# Patient Record
Sex: Male | Born: 1960 | Race: White | Hispanic: No | Marital: Married | Smoking: Never smoker
Health system: Southern US, Community
[De-identification: ages and names within clinical notes are randomized; demographics above are authoritative.]

## PROBLEM LIST (undated history)

## (undated) DIAGNOSIS — R7303 Prediabetes: Secondary | ICD-10-CM

## (undated) DIAGNOSIS — J45909 Unspecified asthma, uncomplicated: Secondary | ICD-10-CM

## (undated) HISTORY — PX: BACK SURGERY: SHX140

## (undated) HISTORY — DX: Unspecified asthma, uncomplicated: J45.909

---

## 2005-03-20 ENCOUNTER — Other Ambulatory Visit: Payer: Self-pay

## 2005-03-20 ENCOUNTER — Emergency Department: Payer: Self-pay | Admitting: Emergency Medicine

## 2007-01-25 ENCOUNTER — Emergency Department: Payer: Self-pay | Admitting: Emergency Medicine

## 2008-08-09 ENCOUNTER — Inpatient Hospital Stay: Payer: Self-pay | Admitting: General Surgery

## 2009-03-06 ENCOUNTER — Emergency Department: Payer: Self-pay | Admitting: Emergency Medicine

## 2009-03-15 ENCOUNTER — Ambulatory Visit: Payer: Self-pay | Admitting: Unknown Physician Specialty

## 2011-04-20 ENCOUNTER — Ambulatory Visit: Payer: Self-pay | Admitting: Internal Medicine

## 2013-05-12 ENCOUNTER — Emergency Department: Payer: Self-pay | Admitting: Emergency Medicine

## 2013-09-29 ENCOUNTER — Emergency Department: Payer: Self-pay | Admitting: Emergency Medicine

## 2013-09-29 LAB — CBC
HCT: 41.9 % (ref 40.0–52.0)
HGB: 14.4 g/dL (ref 13.0–18.0)
MCH: 30.3 pg (ref 26.0–34.0)
MCHC: 34.3 g/dL (ref 32.0–36.0)
MCV: 88 fL (ref 80–100)
Platelet: 194 10*3/uL (ref 150–440)
RBC: 4.75 10*6/uL (ref 4.40–5.90)
RDW: 13.5 % (ref 11.5–14.5)
WBC: 6.4 10*3/uL (ref 3.8–10.6)

## 2013-09-29 LAB — BASIC METABOLIC PANEL
Anion Gap: 4 — ABNORMAL LOW (ref 7–16)
BUN: 21 mg/dL — AB (ref 7–18)
CALCIUM: 8.9 mg/dL (ref 8.5–10.1)
CREATININE: 1.24 mg/dL (ref 0.60–1.30)
Chloride: 103 mmol/L (ref 98–107)
Co2: 28 mmol/L (ref 21–32)
EGFR (Non-African Amer.): >60
GLUCOSE: 170 mg/dL — AB (ref 65–99)
Osmolality: 277 (ref 275–301)
Potassium: 3.8 mmol/L (ref 3.5–5.1)
Sodium: 135 mmol/L — ABNORMAL LOW (ref 136–145)

## 2013-09-29 LAB — TROPONIN I: Troponin-I: <0.02 ng/mL

## 2015-11-20 ENCOUNTER — Emergency Department: Payer: No Typology Code available for payment source

## 2015-11-20 ENCOUNTER — Emergency Department
Admission: EM | Admit: 2015-11-20 | Discharge: 2015-11-20 | Disposition: A | Discharge status: Home / Self Care | Payer: No Typology Code available for payment source | Attending: Emergency Medicine | Admitting: Emergency Medicine

## 2015-11-20 DIAGNOSIS — R200 Anesthesia of skin: Secondary | ICD-10-CM | POA: Insufficient documentation

## 2015-11-20 DIAGNOSIS — R079 Chest pain, unspecified: Secondary | ICD-10-CM | POA: Insufficient documentation

## 2015-11-20 DIAGNOSIS — R202 Paresthesia of skin: Secondary | ICD-10-CM | POA: Insufficient documentation

## 2015-11-20 LAB — BASIC METABOLIC PANEL
ANION GAP: 8 (ref 5–15)
BUN: 23 mg/dL — ABNORMAL HIGH (ref 6–20)
CALCIUM: 9 mg/dL (ref 8.9–10.3)
CO2: 24 mmol/L (ref 22–32)
Chloride: 105 mmol/L (ref 101–111)
Creatinine, Ser: 1.25 mg/dL — ABNORMAL HIGH (ref 0.61–1.24)
GFR calc non Af Amer: >60 mL/min (ref >60)
Glucose, Bld: 137 mg/dL — ABNORMAL HIGH (ref 65–99)
POTASSIUM: 3.8 mmol/L (ref 3.5–5.1)
Sodium: 137 mmol/L (ref 135–145)

## 2015-11-20 LAB — CBC
HCT: 40.5 % (ref 40.0–52.0)
Hemoglobin: 14.2 g/dL (ref 13.0–18.0)
MCH: 31.1 pg (ref 26.0–34.0)
MCHC: 35 g/dL (ref 32.0–36.0)
MCV: 88.8 fL (ref 80.0–100.0)
Platelets: 200 10*3/uL (ref 150–440)
RBC: 4.56 MIL/uL (ref 4.40–5.90)
RDW: 13.2 % (ref 11.5–14.5)
WBC: 7.5 10*3/uL (ref 3.8–10.6)

## 2015-11-20 LAB — TROPONIN I: Troponin I: <0.03 ng/mL (ref <0.031)

## 2015-11-20 MED ORDER — NITROGLYCERIN 2 % TD OINT
1.0000 [in_us] | TOPICAL_OINTMENT | Freq: Four times a day (QID) | TRANSDERMAL | Status: DC
  Administered 2015-11-20: 1 [in_us] via TOPICAL
  Filled 2015-11-20: qty 1

## 2015-11-20 MED ORDER — ACETAMINOPHEN 500 MG PO TABS
ORAL_TABLET | ORAL | Status: AC
  Filled 2015-11-20: qty 2

## 2015-11-20 MED ORDER — NITROGLYCERIN 0.4 MG SL SUBL
0.4000 mg | SUBLINGUAL_TABLET | SUBLINGUAL | Status: DC | PRN
  Administered 2015-11-20 (×2): 0.4 mg via SUBLINGUAL
  Filled 2015-11-20 (×2): qty 1

## 2015-11-20 MED ORDER — ACETAMINOPHEN 500 MG PO TABS
1000.0000 mg | ORAL_TABLET | Freq: Once | ORAL | Status: AC
  Administered 2015-11-20: 1000 mg via ORAL

## 2015-11-20 NOTE — Discharge Instructions (Signed)
Please go to Dr. Philemon Kingdom office at 11:30 AM for a stress test tomorrow morning.  Return to the emergency department if you develop chest pain, shortness of breath, cold or clammy feeling, nausea or vomiting, palpitations, lightheadedness or fainting, or any other symptoms concerning to you.

## 2015-11-20 NOTE — ED Provider Notes (Signed)
[facility] Emergency Department Provider Note  ____________________________________________  Time seen: Approximately 5:31 PM  I have reviewed the triage vital signs and the nursing notes.   HISTORY  Chief Complaint Chest Pain    HPI Chad Rose. is a 55 y.o. male with no past medical history presenting with chest pain. Patient states that at 12:30 today he was at work when he developed a left-sided chest "pressure" with associated numbness and tingling in the left arm. He felt hot but did not break out in a sweat, and denies any nausea, vomiting, lightheadedness, palpitations or syncope. Today he has not been experiencing other episodes of chest pain. He does not have any diagnosed past medical history, but does not see a physician regularly. Last stress test was several years ago and is reportedly negative. Patient was given 3 baby aspirin by EMS.  SH: Denies tobacco abuse or cocaine.  FH: Denies CAD in family members in young ages   History reviewed. No pertinent past medical history.  There are no active problems to display for this patient.   History reviewed. No pertinent past surgical history.  No current outpatient prescriptions on file.  Allergies Review of patient's allergies indicates no known allergies.  No family history on file.  Social History Social History  Substance Use Topics  . Smoking status: Never Smoker   . Smokeless tobacco: None  . Alcohol Use: No    Review of Systems Constitutional: No fever/chills. No lightheadedness or syncope. Positive for hot. No diaphoresis. Eyes: No visual changes. ENT: No sore throat. Cardiovascular: Positive chest pain, without palpitations. Respiratory: Denies shortness of breath.  No cough. Gastrointestinal: No abdominal pain.  No nausea, no vomiting.  No diarrhea.  No constipation. Genitourinary: Negative for dysuria. Musculoskeletal: Negative for back pain. Positive left upper  extremity tingling. Skin: Negative for rash. Neurological: Negative for headaches, focal weakness or numbness.  10-point ROS otherwise negative.  ____________________________________________   PHYSICAL EXAM:  VITAL SIGNS: ED Triage Vitals  Enc Vitals Group     BP --      Pulse --      Resp --      Temp 11/20/15 1721 98 F (36.7 C)     Temp src --      SpO2 --      Weight --      Height 11/20/15 1721  (1.753 m)     Head Cir --      Peak Flow --      Pain Score 11/20/15 1723 5     Pain Loc --      Pain Edu? --      Excl. in GC? --     Constitutional: Alert and oriented. Well appearing and in no acute distress. Answer question appropriately. Eyes: Conjunctivae are normal.  EOMI. Head: Atraumatic. Nose: No congestion/rhinnorhea. Mouth/Throat: Mucous membranes are moist.  Neck: No stridor.  Supple.  No JVD Cardiovascular: Normal rate, regular rhythm. No murmurs, rubs or gallops.  Respiratory: Normal respiratory effort.  No retractions. Lungs CTAB.  No wheezes, rales or ronchi. Gastrointestinal: Soft and nontender. No distention. No peritoneal signs. Musculoskeletal: No LE edema. No palpable cords or tenderness to palpation in the calves. Negative Homans sign. Neurologic:  Normal speech and language. No gross focal neurologic deficits are appreciated.  Skin:  Skin is warm, dry and intact. No rash noted. Psychiatric: Mood and affect are normal. Speech and behavior are normal.  Normal judgement.  ____________________________________________  LABS (all labs ordered are listed, but only abnormal results are displayed)  Labs Reviewed  BASIC METABOLIC PANEL - Abnormal; Notable for the following:    Glucose, Bld 137 (*)    BUN 23 (*)    Creatinine, Ser 1.25 (*)    All other components within normal limits  CBC  TROPONIN I   ____________________________________________  EKG  ED ECG REPORT I, Rockne Menghini, the attending physician, personally viewed and  interpreted this ECG.   Date: 11/20/2015  EKG Time: 1716  Rate: 80  Rhythm: normal sinus rhythm  Axis: Normal  Intervals:none  ST&T Change: No ST elevation. No ischemic changes.  ____________________________________________  RADIOLOGY  Dg Chest 2 View  11/20/2015  CLINICAL DATA:  Chest pain starting 3 hours ago. No known injury. Initial encounter. EXAM: CHEST  2 VIEW COMPARISON:  PA and lateral chest 09/29/2013. FINDINGS: The lungs are clear. Heart size is normal. There is no pneumothorax or pleural effusion. No focal bony abnormality. IMPRESSION: Negative chest. Electronically Signed   By: Drusilla Kanner M.D.   On: 11/20/2015 17:57    ____________________________________________   PROCEDURES  Procedure(s) performed: None  Critical Care performed: No ____________________________________________   INITIAL IMPRESSION / ASSESSMENT AND PLAN / ED COURSE  Pertinent labs & imaging results that were available during my care of the patient were reviewed by me and considered in my medical decision making (see chart for details).  55 y.o. male with no known cardiac risk factors including in his medical history or his social history, or his family history, presenting with a single episode of chest pain which started 5.5 hours ago. The patient's EKG is reassuring and does not show any ischemic changes. I have a troponin that is pending. We also get a chest x-ray. The patient has a reassuring physical examination as well. The patient is a low risk patient without a history that is suggestive of escalating angina, so if his workup in the emergency department is reassuring, I will contact cardiology to schedule an outpatient stress test for further cardiac risk stratification.  ----------------------------------------- 6:16 PM on 11/20/2015 -----------------------------------------  The patient's pain has resolved with sublingual nitroglycerin. He remains clinically stable. His EKG does not  show ischemic changes and his troponin is negative. His no abnormalities on his chest x-ray. I will speak with cardiology for a follow-up appointment for stress testing, and plan discharge home. Return precautions as well as follow up instructions are discussed.  ____________________________________________  FINAL CLINICAL IMPRESSION(S) / ED DIAGNOSES  Final diagnoses:  Chest pain, unspecified chest pain type  Numbness and tingling in left arm      NEW MEDICATIONS STARTED DURING THIS VISIT:  New Prescriptions   No medications on file     Rockne Menghini, MD 11/20/15 [facility]

## 2015-11-20 NOTE — ED Notes (Addendum)
Pt from [facility] urgent care via EMS, reports central chest pain for past 3 hours with radiation down left arm Pt took 325 aspirin prior to arrival

## 2015-11-20 NOTE — ED Notes (Signed)
Reviewed d/c instructions, and follow-up care with pt. Pt verbalized understanding 

## 2015-11-20 NOTE — ED Notes (Signed)
Removed nitro paste per MD order

## 2017-09-09 DIAGNOSIS — J329 Chronic sinusitis, unspecified: Secondary | ICD-10-CM | POA: Diagnosis not present

## 2017-09-09 DIAGNOSIS — R062 Wheezing: Secondary | ICD-10-CM | POA: Diagnosis not present

## 2017-11-25 ENCOUNTER — Ambulatory Visit (INDEPENDENT_AMBULATORY_CARE_PROVIDER_SITE_OTHER): Payer: BLUE CROSS/BLUE SHIELD | Admitting: Family Medicine

## 2017-11-25 ENCOUNTER — Encounter: Payer: Self-pay | Admitting: Family Medicine

## 2017-11-25 VITALS — BP 102/69 | HR 74 | Temp 98.0°F | Resp 16 | Ht 69.0 in | Wt 191.0 lb

## 2017-11-25 DIAGNOSIS — R5383 Other fatigue: Secondary | ICD-10-CM

## 2017-11-25 DIAGNOSIS — Z7689 Persons encountering health services in other specified circumstances: Secondary | ICD-10-CM | POA: Diagnosis not present

## 2017-11-25 DIAGNOSIS — R7309 Other abnormal glucose: Secondary | ICD-10-CM | POA: Diagnosis not present

## 2017-11-25 DIAGNOSIS — Z125 Encounter for screening for malignant neoplasm of prostate: Secondary | ICD-10-CM

## 2017-11-25 DIAGNOSIS — N401 Enlarged prostate with lower urinary tract symptoms: Secondary | ICD-10-CM | POA: Diagnosis not present

## 2017-11-25 DIAGNOSIS — F5101 Primary insomnia: Secondary | ICD-10-CM | POA: Diagnosis not present

## 2017-11-25 DIAGNOSIS — Z0000 Encounter for general adult medical examination without abnormal findings: Secondary | ICD-10-CM | POA: Diagnosis not present

## 2017-11-25 DIAGNOSIS — R351 Nocturia: Secondary | ICD-10-CM

## 2017-11-25 MED ORDER — TAMSULOSIN HCL 0.4 MG PO CAPS: 0.4000 mg | ORAL_CAPSULE | Freq: Every day | ORAL | 5 refills | Status: DC

## 2017-11-25 NOTE — Assessment & Plan Note (Signed)
See A&P Suspect related to poor sleep hygiene primarily Nocturia with BPH affecting

## 2017-11-25 NOTE — Assessment & Plan Note (Signed)
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 Start Tamsulosin 0.4mg  daily, advised on benefits, risks, if BP low caution with sudden standing up or position change 2. Follow-up consider future increased dose (doxazosin) vs add Finasteride alpha blocker to reduce prostate size (note change in PSA), future referral to Urology if remains uncontrolled

## 2017-11-25 NOTE — Assessment & Plan Note (Signed)
Seems multifactorial, presumed poor sleep hygiene, long work hours, waking up overnight with nocturia d/t BPH Not suggestive of OSA, despite daytime sleepiness, neg screening  Plan - Treat BPH with Flomax for better sleep - Sleep hygiene handout given - Check labs for other etiology, history of elevated glucose / will check TSH CBC chemistry - Follow-up 4 weeks

## 2017-11-25 NOTE — Progress Notes (Signed)
Subjective:    Patient ID: Chad Gross., male    DOB: [birth year], 57 y.o.   MRN: [id number]  Chad Currier. is a 57 y.o. male presenting on 11/25/2017 for Establish Care (fatigue,exhausted, no energy)  Has not had a regular doctor >15+ years. He is local and has been living in this area for while now. He has 2 children Aneta Mins and [place], and older son, Sharia Reeve in [place].  HPI   History of Back Surgeries - Reports prior history of x 4 back surgeries (initial in early 1990s), last done 2011, L4-5 laminectomy, has not had fusion. Most recently with [facility] Dr Gerrit Heck  History of Abnormal EKG Reports some mild chest pain, had EKG showed abnormal lead, he was taken to see Cardiology to follow-up with Dr Gwen Pounds [facility], had stress test that was negative.  Mild Asthma intermittent History of childhood asthma, with some mild lingering symptoms, has rare occasionally flare up with wheezing and short of breath with excessive exertion.  Tiredness / Excessive Daytime / Insomnia Reports poor sleep over >30 years, goes to sleep late usually due to work. More recently waking up overnight frequent. Sometimes he is sleeping on couch to wake up too much. He cannot fall asleep without white noise, noise machine, often his mind will be overactive. - No history of OSA or problems with apnea events witnessed or breathing at night. 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 No physical demands of job. 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   BPH / Sleep Nocturia He was seen by Urologist few years ago, was dx with BPH after DRE and symptoms, no medicine at that time, but he may need. Prostate 5XL vitamin supplement x 2 daily, started 1 week ago. He feels slight improvement with less nocturia now 2-3 times instead of 5-6. He admits he is not sleeping well at  night and will have urge to urinate.  AUA BPH Symptom Score over past 1 month 1. Sensation of not emptying bladder post void - 1 2. Urinate less than 2 hour after finish last void - 1 3. Start/Stop several times during void - 3 4. Difficult to postpone urination - 0 5. Weak urinary stream - 3 6. Push or strain urination - 2 7. Nocturia - 3-5 times (improved on prostate supplement)  Total Score: 14 (Moderate BPH symptoms)   Epworth Sleepiness Scale Total Score: 3 Sitting and reading - 0 Watching TV - 3 Sitting inactive in a public place - 0 As a passenger in a car for an hour without a break - 0 Lying down to rest in the afternoon when circumstances permit - 0 Sitting and talking to someone - 0 Sitting quietly after a lunch without alcohol - 0 In a car, while stopped for a few minutes in traffic - 0   STOP-Bang OSA scoring Snoring yes   Tiredness yes   Observed apneas no   Pressure HTN no   BMI > 35 kg/m2 no   Age > 50  yes   Neck (male >17 in; Male >16 in)  yes 31.5"  Gender male yes   OSA risk low (0-2)  OSA risk intermediate (3-4)  OSA risk high (5+)  Total: 5 (High Risk)   Health Maintenance:  Colon CA Screening: Never had colonoscopy. Currently asymptomatic. No  known family history of colon CA. Due for screening test age 61>50, will consider referral to GI at next visit, possible cologuard as well.   Depression screen PHQ 2/9 11/25/2017  Decreased Interest 0  Down, Depressed, Hopeless 0  PHQ - 2 Score 0    Past Medical History:  Diagnosis Date  . Asthma    Past Surgical History:  Procedure Laterality Date  . BACK SURGERY     Social History   Socioeconomic History  . Marital status: Married    Spouse name: Not on file  . Number of children: 3  . Years of education: [employer]  . Highest education level: High school graduate  Social Needs  . Financial resource strain: Not on file  . Food insecurity - worry: Not on file  . Food insecurity -  inability: Not on file  . Transportation needs - medical: Not on file  . Transportation needs - non-medical: Not on file  Occupational History  . Occupation: Nurse, [employer] Manager    Comment: [place] / [place]  Tobacco Use  . Smoking status: Never Smoker  . Smokeless tobacco: Never Used  Substance and Sexual Activity  . Alcohol use: Yes    Alcohol/week: 2.4 oz    Types: 4 Cans of beer per week  . Drug use: No  . Sexual activity: Not on file  Other Topics Concern  . Not on file  Social History Narrative  . Not on file   Family History  Problem Relation Age of Onset  . Throat cancer Mother   . Alcohol abuse Father   . Cirrhosis Father   . Pneumonia Father   . Diabetes Neg Hx   . Prostate cancer Neg Hx   . Colon cancer Neg Hx    Current Outpatient Medications on File Prior to Visit  Medication Sig  . Multiple Vitamin (MULTIVITAMIN) capsule Take 1 capsule by mouth daily.  . naproxen sodium (ALEVE) 220 MG tablet Take 220 mg by mouth.  Marland Kitchen. Specialty Vitamins Products (PROSTATE PO) Take by mouth.   No current facility-administered medications on file prior to visit.     Review of Systems  Constitutional: Negative for activity change, appetite change, chills, diaphoresis, fatigue and fever.  HENT: Negative for congestion and hearing loss.   Eyes: Negative for visual disturbance.  Respiratory: Negative for apnea, cough, chest tightness, shortness of breath and wheezing.   Cardiovascular: Negative for chest pain, palpitations and leg swelling.  Gastrointestinal: Negative for abdominal pain, anal bleeding, blood in stool, constipation, diarrhea, nausea and vomiting.  Endocrine: Negative for cold intolerance and polyuria.  Genitourinary: Negative for decreased urine volume, difficulty urinating, dysuria, frequency, hematuria, testicular pain and urgency.       Nocturia, LUTS  Musculoskeletal: Negative for arthralgias and neck pain.  Skin: Negative for rash.  Allergic/Immunologic:  Negative for environmental allergies.  Neurological: Negative for dizziness, weakness, light-headedness, numbness and headaches.  Hematological: Negative for adenopathy.  Psychiatric/Behavioral: Negative for behavioral problems, dysphoric mood and sleep disturbance. The patient is not nervous/anxious.    Per HPI unless specifically indicated above     Objective:    BP 102/69   Pulse 74   Temp 98 F (36.7 C) (Oral)   Resp 16   Ht 5\' 9"  (1.753 m)   Wt 191 lb (86.6 kg)   BMI 28.21 kg/m   Wt Readings from Last 3 Encounters:  11/25/17 191 lb (86.6 kg)    Physical Exam  Constitutional: He is oriented to person, place,  and time. He appears well-developed and well-nourished. No distress.  Well-appearing, comfortable, cooperative  HENT:  Head: Normocephalic and atraumatic.  Mouth/Throat: Oropharynx is clear and moist.  Eyes: Conjunctivae and EOM are normal. Pupils are equal, round, and reactive to light. Right eye exhibits no discharge. Left eye exhibits no discharge.  Neck: Normal range of motion. Neck supple. No thyromegaly present.  Cardiovascular: Normal rate, regular rhythm, normal heart sounds and intact distal pulses.  No murmur heard. Pulmonary/Chest: Effort normal and breath sounds normal. No respiratory distress. He has no wheezes. He has no rales.  Abdominal: Soft. Bowel sounds are normal. He exhibits no distension and no mass. There is no tenderness.  Musculoskeletal: Normal range of motion. He exhibits no edema or tenderness.  Upper / Lower Extremities: - Normal muscle tone, strength bilateral upper extremities 5/5, lower extremities 5/5  Lymphadenopathy:    He has no cervical adenopathy.  Neurological: He is alert and oriented to person, place, and time.  Distal sensation intact to light touch all extremities  Skin: Skin is warm and dry. No rash noted. He is not diaphoretic. No erythema.  Psychiatric: He has a normal mood and affect. His behavior is normal.  Well  groomed, good eye contact, normal speech and thoughts  Nursing note and vitals reviewed.  Results for orders placed or performed during the hospital encounter of 11/20/15  CBC  Result Value Ref Range   WBC 7.5 3.8 - 10.6 K/uL   RBC 4.56 4.40 - 5.90 MIL/uL   Hemoglobin 14.2 13.0 - 18.0 g/dL   HCT 81.1 91.4 - 78.2 %   MCV 88.8 80.0 - 100.0 fL   MCH 31.1 26.0 - 34.0 pg   MCHC 35.0 32.0 - 36.0 g/dL   RDW 95.6 21.3 - 08.6 %   Platelets 200 150 - 440 K/uL  Basic metabolic panel  Result Value Ref Range   Sodium 137 135 - 145 mmol/L   Potassium 3.8 3.5 - 5.1 mmol/L   Chloride 105 101 - 111 mmol/L   CO2 24 22 - 32 mmol/L   Glucose, Bld 137 (H) 65 - 99 mg/dL   BUN 23 (H) 6 - 20 mg/dL   Creatinine, Ser 5.78 (H) 0.61 - 1.24 mg/dL   Calcium 9.0 8.9 - 46.9 mg/dL   GFR calc non Af Amer >60 >60 mL/min   GFR calc Af Amer >60 >60 mL/min   Anion gap 8 5 - 15  Troponin I  Result Value Ref Range   Troponin I <0.03 <0.031 ng/mL      Assessment & Plan:   Problem List Items Addressed This Visit    Benign prostatic hyperplasia with nocturia - Primary    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 Start Tamsulosin 0.4mg  daily, advised on benefits, risks, if BP low caution with sudden standing up or position change 2. Follow-up consider future increased dose (doxazosin) vs add Finasteride alpha blocker to reduce prostate size (note change in PSA), future referral to Urology if remains uncontrolled      Relevant Medications   tamsulosin (FLOMAX) 0.4 MG CAPS capsule   Primary insomnia    See A&P Suspect related to poor sleep hygiene  primarily Nocturia with BPH affecting       Relevant Orders   TSH   T4, free   VITAMIN  D 25 Hydroxy (Vit-D Deficiency, Fractures)   Tiredness    Seems multifactorial, presumed poor sleep hygiene, long work hours, waking up overnight with nocturia d/t BPH Not suggestive of OSA, despite daytime sleepiness, neg screening  Plan - Treat BPH with Flomax for better sleep - Sleep hygiene handout given - Check labs for other etiology, history of elevated glucose / will check TSH CBC chemistry - Follow-up 4 weeks      Relevant Orders   TSH   CBC with Differential/Platelet   T4, free   VITAMIN D 25 Hydroxy (Vit-D Deficiency, Fractures)    Other Visit Diagnoses    Encounter to establish care with new doctor       Annual physical exam     Reviewed health maintenance Declined routine HIV/Hep C Proceed with Colon CA Screening colonoscopy most likely in future Encourage improve healthy lifestyle diet and exercise, maintain healthy wt    Relevant Orders   COMPLETE METABOLIC PANEL WITH GFR   Lipid panel   Hemoglobin A1c   TSH   CBC with Differential/Platelet   Screening for prostate cancer       Check PSA   Relevant Orders   PSA, Total with Reflex to PSA, Free   Fatigue, unspecified type       Relevant Orders   CBC with Differential/Platelet   VITAMIN D 25 Hydroxy (Vit-D Deficiency, Fractures)   Abnormal glucose       Relevant Orders   Hemoglobin A1c      Meds ordered this encounter  Medications  . tamsulosin (FLOMAX) 0.4 MG CAPS capsule    Sig: Take 1 capsule (0.4 mg total) by mouth daily.    Dispense:  30 capsule    Refill:  5    Follow up plan: Return in about 4 weeks (around 12/23/2017) for BPH, Tiredness, follow-up.  Saralyn Pilar, DO [facility] 11/25/2017, 7:00 PM

## 2017-11-25 NOTE — Patient Instructions (Addendum)
Thank you for coming to the office today  1. Blood today - fasting labs -stay tuned for results 24-48 hours, likely next week - mychart, or call if questions  Start Tamsulosin (Flomax) 0.31m once daily in morning - should work quickly, caution sudden standing if light headed.  If need can switch pill to take at night-time.  AUA BPH Symptom Score over past 1 month 1. Sensation of not emptying bladder post void - 1 2. Urinate less than 2 hour after finish last void - 1 3. Start/Stop several times during void - 3 4. Difficult to postpone urination - 0 5. Weak urinary stream - 3 6. Push or strain urination - 2 7. Nocturia - 3-5 times (improved on prostate supplement)  Try to limit liquids in evening, pick a cut off time approx 9pm  Reduce sodas, and drink more water.  Sleep Hygiene Recommendations to promote healthy sleep in all patients, especially if symptoms of insomnia are worsening. Due to the nature of sleep rhythms, if your body gets "out of rhythm", it may take some time before your sleep cycle can be "reset".  Please try to follow as many of the following tips as you can, usually there are only a few of these are the primary cause of the problem.  ?To reset your sleep rhythm, go to bed and get up at the same time every day ?Sleep only long enough to feel rested and then get out of bed ?Do not try to force yourself to sleep. If you can't sleep, get out of bed and try again later. ?Avoid naps during the day, unless excessively tired. The more sleeping during the day, then the less sleep your body needs at night.  ?Have coffee, tea, and other foods that have caffeine only in the morning ?Exercise several days a week, but not right before bed ?If you drink alcohol, prefer to have appropriate drink with one meal, but prefer to avoid alcohol in the evening, and bedtime ?If you smoke, avoid smoking, especially in the evening  ?Avoid watching TV or looking at phones, computers, or  reading devices ("e-books") that give off light at least 30 minutes before bed. This artificial light sends "awake signals" to your brain and can make it harder to fall asleep. ?Make your bedroom a comfortable place where it is easy to fall asleep: ? Put up shades or special blackout curtains to block light from outside. ? Use a white noise machine to block noise. ? Keep the temperature cool. ?Try your best to solve or at least address your problems before you go to bed ?Use relaxation techniques to manage stress. Ask your health care provider to suggest some techniques that may work well for you. These may include: ? Breathing exercises. ? Routines to release muscle tension. ? Visualizing peaceful scenes.   Colon Cancer Screening: - For all adults age 364+routine colon cancer screening is highly recommended.     - Recent guidelines from [place] starting age of 446- Early detection of colon cancer is important, because often there are no warning signs or symptoms, also if found early usually it can be cured. Late stage is hard to treat.  - If you are not interested in Colonoscopy screening (if done and normal you could be cleared for 5 to 10 years until next due), then Cologuard is an excellent alternative for screening test for Colon Cancer. It is highly sensitive for detecting DNA of colon cancer from even  the earliest stages. Also, there is NO bowel prep required. - If Cologuard is NEGATIVE, then it is good for 3 years before next due - If Cologuard is POSITIVE, then it is strongly advised to get a Colonoscopy, which allows the GI doctor to locate the source of the cancer or polyp (even very early stage) and treat it by removing it. ------------------------- If you would like to proceed with Cologuard (stool DNA test) - FIRST, call your insurance company and tell them you want to check cost of Cologuard tell them CPT Code [phone] (it may be completely covered and you  could get for no cost, OR max cost without any coverage is about $600). Also, keep in mind if you do NOT open the kit, and decide not to do the test, you will NOT be charged, you should contact the company if you decide not to do the test. - If you want to proceed, you can notify us (phone message, [place], or at next visit) and we will order it for you. The test kit will be delivered to you house within about 1 week. Follow instructions to collect sample, you may call the company for any help or questions, 24/7 telephone support at [phone].  Future colonoscopy let me know for referral   Please schedule a Follow-up Appointment to: Return in about 4 weeks (around 12/23/2017) for BPH, Tiredness, follow-up.    If you have any other questions or concerns, please feel free to call the office or send a message through [place]. You may also schedule an earlier appointment if necessary.  Additionally, you may be receiving a survey about your experience at our office within a few days to 1 week by e-mail or mail. We value your feedback.  Nobie Putnam, DO [place]

## 2017-11-26 ENCOUNTER — Encounter: Payer: Self-pay | Admitting: Family Medicine

## 2017-11-26 DIAGNOSIS — E1169 Type 2 diabetes mellitus with other specified complication: Secondary | ICD-10-CM | POA: Insufficient documentation

## 2017-11-26 LAB — CBC WITH DIFFERENTIAL/PLATELET
BASOS ABS: 22 {cells}/uL (ref 0–200)
BASOS PCT: 0.4 %
EOS PCT: 1.1 %
Eosinophils Absolute: 61 cells/uL (ref 15–500)
HCT: 45.2 % (ref 38.5–50.0)
Hemoglobin: 15.9 g/dL (ref 13.2–17.1)
Lymphs Abs: 2321 cells/uL (ref 850–3900)
MCH: 31.1 pg (ref 27.0–33.0)
MCHC: 35.2 g/dL (ref 32.0–36.0)
MCV: 88.5 fL (ref 80.0–100.0)
MONOS PCT: 9.1 %
MPV: 9.9 fL (ref 7.5–12.5)
NEUTROS ABS: 2596 {cells}/uL (ref 1500–7800)
Neutrophils Relative %: 47.2 %
PLATELETS: 209 10*3/uL (ref 140–400)
RBC: 5.11 10*6/uL (ref 4.20–5.80)
RDW: 12.6 % (ref 11.0–15.0)
Total Lymphocyte: 42.2 %
WBC mixed population: 501 cells/uL (ref 200–950)
WBC: 5.5 10*3/uL (ref 3.8–10.8)

## 2017-11-26 LAB — COMPLETE METABOLIC PANEL WITH GFR
AG RATIO: 1.4 (calc) (ref 1.0–2.5)
ALT: 31 U/L (ref 9–46)
AST: 21 U/L (ref 10–35)
Albumin: 4.5 g/dL (ref 3.6–5.1)
Alkaline phosphatase (APISO): 93 U/L (ref 40–115)
BUN: 17 mg/dL (ref 7–25)
CALCIUM: 9.8 mg/dL (ref 8.6–10.3)
CO2: 29 mmol/L (ref 20–32)
Chloride: 102 mmol/L (ref 98–110)
Creat: 1.14 mg/dL (ref 0.70–1.33)
GFR, EST AFRICAN AMERICAN: 83 mL/min/{1.73_m2} (ref >=60)
GFR, Est Non African American: 71 mL/min/{1.73_m2} (ref >=60)
GLUCOSE: 124 mg/dL — AB (ref 65–99)
Globulin: 3.3 g/dL (calc) (ref 1.9–3.7)
POTASSIUM: 4.8 mmol/L (ref 3.5–5.3)
Sodium: 138 mmol/L (ref 135–146)
TOTAL PROTEIN: 7.8 g/dL (ref 6.1–8.1)
Total Bilirubin: 0.5 mg/dL (ref 0.2–1.2)

## 2017-11-26 LAB — LIPID PANEL
Cholesterol: 264 mg/dL — ABNORMAL HIGH (ref <200)
HDL: 40 mg/dL — AB (ref >40)
LDL CHOLESTEROL (CALC): 166 mg/dL — AB
Non-HDL Cholesterol (Calc): 224 mg/dL (calc) — ABNORMAL HIGH (ref <130)
TRIGLYCERIDES: 345 mg/dL — AB (ref <150)
Total CHOL/HDL Ratio: 6.6 (calc) — ABNORMAL HIGH (ref <5.0)

## 2017-11-26 LAB — HEMOGLOBIN A1C
HEMOGLOBIN A1C: 7 %{Hb} — AB (ref <5.7)
Mean Plasma Glucose: 154 (calc)
eAG (mmol/L): 8.5 (calc)

## 2017-11-26 LAB — T4, FREE: Free T4: 1.7 ng/dL (ref 0.8–1.8)

## 2017-11-26 LAB — PSA, TOTAL WITH REFLEX TO PSA, FREE: PSA, Total: 0.6 ng/mL (ref <=4.0)

## 2017-11-26 LAB — TSH: TSH: 5.25 m[IU]/L — AB (ref 0.40–4.50)

## 2017-11-26 LAB — VITAMIN D 25 HYDROXY (VIT D DEFICIENCY, FRACTURES): VIT D 25 HYDROXY: 23 ng/mL — AB (ref 30–100)

## 2017-12-14 ENCOUNTER — Telehealth: Payer: Self-pay | Admitting: Family Medicine

## 2017-12-14 NOTE — Telephone Encounter (Signed)
Pt asked to have a glucometer and strips [facility].  He wants to make sure insurance will pay for it.  His call back number is [phone]

## 2017-12-15 ENCOUNTER — Other Ambulatory Visit: Payer: Self-pay | Admitting: Family Medicine

## 2017-12-15 ENCOUNTER — Other Ambulatory Visit: Payer: Self-pay

## 2017-12-15 MED ORDER — GLUCOSE BLOOD VI STRP: ORAL_STRIP | 12 refills | Status: DC

## 2017-12-15 MED ORDER — CONTOUR NEXT EZ W/DEVICE KIT: 1.0000 | PACK | Freq: Two times a day (BID) | 0 refills | Status: DC

## 2017-12-15 NOTE — Telephone Encounter (Signed)
Pt. Called requesting contour EZ. Pt. Call back # is  [phone]    RX [phone]

## 2017-12-15 NOTE — Telephone Encounter (Signed)
Pt advised to call insurance company to find out their preference for glucometer and call us back with their answer.

## 2017-12-15 NOTE — Telephone Encounter (Signed)
Rx send

## 2017-12-23 ENCOUNTER — Ambulatory Visit: Payer: BLUE CROSS/BLUE SHIELD | Admitting: Family Medicine

## 2017-12-23 ENCOUNTER — Other Ambulatory Visit: Payer: Self-pay | Admitting: Family Medicine

## 2017-12-23 ENCOUNTER — Encounter: Payer: Self-pay | Admitting: Family Medicine

## 2017-12-23 VITALS — BP 105/65 | HR 68 | Temp 98.0°F | Resp 16 | Ht 69.0 in | Wt 184.0 lb

## 2017-12-23 DIAGNOSIS — E039 Hypothyroidism, unspecified: Secondary | ICD-10-CM | POA: Insufficient documentation

## 2017-12-23 DIAGNOSIS — R7989 Other specified abnormal findings of blood chemistry: Secondary | ICD-10-CM

## 2017-12-23 DIAGNOSIS — E559 Vitamin D deficiency, unspecified: Secondary | ICD-10-CM | POA: Diagnosis not present

## 2017-12-23 DIAGNOSIS — R7309 Other abnormal glucose: Secondary | ICD-10-CM

## 2017-12-23 DIAGNOSIS — R351 Nocturia: Secondary | ICD-10-CM | POA: Diagnosis not present

## 2017-12-23 DIAGNOSIS — F5101 Primary insomnia: Secondary | ICD-10-CM | POA: Diagnosis not present

## 2017-12-23 DIAGNOSIS — R5383 Other fatigue: Secondary | ICD-10-CM | POA: Diagnosis not present

## 2017-12-23 DIAGNOSIS — N401 Enlarged prostate with lower urinary tract symptoms: Secondary | ICD-10-CM

## 2017-12-23 DIAGNOSIS — E038 Other specified hypothyroidism: Secondary | ICD-10-CM | POA: Insufficient documentation

## 2017-12-23 NOTE — Assessment & Plan Note (Signed)
Improved on alpha blocker for BPH less nocturia Still chronic insomnia Monitor symptoms, since improving Future reconsider medicines for sleep if indicated, defer for now

## 2017-12-23 NOTE — Assessment & Plan Note (Signed)
Significant improved now with improved blood sugar and less nocturia on medicine - Seems multifactorial, still some insomnia Elevated TSH mildly, less likely to be cause Mild low Vit D Not suggestive of OSA, despite daytime sleepiness, neg screening  Plan Continue Flomax for BPH, reduce nocturia Continue improving sleep hygiene Re-check labs in 3 months - A1c, Vit D TSH

## 2017-12-23 NOTE — Patient Instructions (Addendum)
Thank you for coming to the office today.  Keep up the great work with diabetic diet and lowering blood sugar  Diet sounds good, continue this plan, use moderation with portion sizes  Keep improving regular walking exercise  Continue Vitamin D supplement  Continue Tamsulosin for prostate, seems like it is working well  As sugar improves and less waking up at night, I think the fatigue tiredness will improve as well  We can consider other sleeping medicines in future if need   DUE for FASTING BLOOD WORK (no food or drink after midnight before the lab appointment, only water or coffee without cream/sugar on the morning of)  SCHEDULE "Lab Only" visit in the morning at the clinic for lab draw in 3 MONTHS   - Make sure Lab Only appointment is at about 1 week before your next appointment, so that results will be available  For Lab Results, once available within 2-3 days of blood draw, you can can log in to MyChart online to view your results and a brief explanation. Also, we can discuss results at next follow-up visit.   Please schedule a Follow-up Appointment to: Return in about 3 months (around 03/25/2018) for Lab review PreDM, TSH, Vit D, Tired.  If you have any other questions or concerns, please feel free to call the office or send a message through MyChart. You may also schedule an earlier appointment if necessary.  Additionally, you may be receiving a survey about your experience at our office within a few days to 1 week by e-mail or mail. We value your feedback.  Saralyn PilarAlexander Shaquira Moroz, DO [facility]

## 2017-12-23 NOTE — Assessment & Plan Note (Signed)
Unlikely to be cause of his tiredness and symptoms No treatment now, likely subclinical Re-check TSH Free T4 in 3 months

## 2017-12-23 NOTE — Assessment & Plan Note (Signed)
Significantly improved, chronic BPH with  lower urinary tract symptoms (LUTS) - AUA BPH score improved 5 from 14 - on Tamsulosin - Off Saw Palmetto - Last PSA 0.6 - Last DRE reported mild BPH or enlarged few year ago - No known personal/family history of prostate CA  Plan: 1. Continue Tamsulosin 0.4mg  daily, advised on benefits, risks, if BP low caution with sudden standing up or position change 2. Follow-up as needed, we can consider double dose in future if need vs finasteride

## 2017-12-23 NOTE — Progress Notes (Signed)
Subjective:    Patient ID: Chad Gross., male    DOB: [birth year], 57 y.o.   MRN: [id number]  Chad Rose. is a 57 y.o. male presenting on 12/23/2017 for Hyperglycemia, elevated A1c (highest 143 and lowest 97 and avar. 115)   HPI   Elevated A1c / Hyperglycemia Reports he has done very well improving lifestyle since he received phone call from me 1 month ago after his labs showed A1c 7.0. He has overhauled diet, see below. Now checking sugars. CBGs: Avg 115, Low 97 (asymptomatic), High 143. Checks CBGs 1-2x most days Meds: Never on meds Currently not on ACEi / ARB Lifestyle: - Weight down 7 lbs in 1 month - Diet (Changed diet better DM diet, no red meat, no fried, eats mostly chicken fish, greens, broccoli, drinking water, quit sodas completely in past 1 month) - Exercise (Started exercise walking 1-2x weekly, getting into regular schedule, will increase with more time from work and warmer weather) Denies hypoglycemia, polyuria, visual changes, numbness or tingling.  FOLLOW-UP Tiredness / Excessive Daytime / Insomnia Last visit 10/2017 to establish care, reviewed this background with chronic poor sleep >30 years, see background history. - Today he reports overall significantly improved tiredness. Now he feels much better, less tired and he has more energy overall. He attributes this to lifestyle and less waking up overnight with better sleep due to less nocturia, see below. - He still has some baseline chronic insomnia issues at times, some difficulty falling back asleep at times - Still has some, difficulty cannot fall asleep without white noise, noise machine, often his mind will be overactive. - No history of OSA or problems with apnea events witnessed or breathing at night. - Still working a lot and has work stress  BPH / Sleep Nocturia - Last visit with me 10/2017, for initial visit for establish care and review same problem BPH, treated with new start Tamsulosin 0.4mg   daily and stopped his OTC prostate supplement, see prior notes for background information. - Interval update with significant improvement in BPH symptoms, see score below - Today patient reports he is doing well on Tamsulosin, tolerating well without side effects, urination is improved, and able to sleep better at night now - No longer seen by Urologist, years ago - He stopped prostate 5XL supplement  New reading today 12/23/17 AUA BPH Symptom Score over past 1 month 1. Sensation of not emptying bladder post void - 1 2. Urinate less than 2 hour after finish last void - 1 3. Start/Stop several times during void - 0 (improved 3) 4. Difficult to postpone urination - 0 5. Weak urinary stream - 0 (improved 3) 6. Push or strain urination - 0 (improved 2) 7. Nocturia - 3 times (improved 5-7)  12/23/17 = Total Score: 5 (Mild BPH symptoms, significant improvement on Tamsulosin) 11/25/17 = Last score 11/25/17 - 14 (Moderate BPH symptoms)  Additional updates - Recent lab showed elevated TSH, no prior history of hypothyroidism. Also he had low Vitamin D on lab, he is now taking Vitamin D3 daily   Depression screen [facility] 2/9 12/23/2017 11/25/2017  Decreased Interest 0 0  Down, Depressed, Hopeless 0 0  PHQ - 2 Score 0 0    Social History   Tobacco Use  . Smoking status: Never Smoker  . Smokeless tobacco: Never Used  Substance Use Topics  . Alcohol use: Yes    Alcohol/week: 2.4 oz    Types: 4 Cans of beer per week  .  Drug use: No    Review of Systems Per HPI unless specifically indicated above     Objective:    BP 105/65   Pulse 68   Temp 98 F (36.7 C) (Oral)   Resp 16   Ht 5\' 9"  (1.753 m)   Wt 184 lb (83.5 kg)   BMI 27.17 kg/m   Wt Readings from Last 3 Encounters:  12/23/17 184 lb (83.5 kg)  11/25/17 191 lb (86.6 kg)    Physical Exam  Constitutional: He is oriented to person, place, and time. He appears well-developed and well-nourished. No distress.  Well-appearing,  comfortable, cooperative  HENT:  Head: Normocephalic and atraumatic.  Mouth/Throat: Oropharynx is clear and moist.  Eyes: Conjunctivae are normal. Right eye exhibits no discharge. Left eye exhibits no discharge.  Neck: Normal range of motion. Neck supple. No thyromegaly present.  Cardiovascular: Normal rate, regular rhythm, normal heart sounds and intact distal pulses.  No murmur heard. Pulmonary/Chest: Effort normal and breath sounds normal. No respiratory distress. He has no wheezes. He has no rales.  Musculoskeletal: Normal range of motion. He exhibits no edema.  Neurological: He is alert and oriented to person, place, and time.  Skin: Skin is warm and dry. No rash noted. He is not diaphoretic. No erythema.  Psychiatric: He has a normal mood and affect. His behavior is normal.  Well groomed, good eye contact, normal speech and thoughts  Nursing note and vitals reviewed.  Results for orders placed or performed in visit on 11/25/17  COMPLETE METABOLIC PANEL WITH GFR  Result Value Ref Range   Glucose, Bld 124 (H) 65 - 99 mg/dL   BUN 17 7 - 25 mg/dL   Creat 1.61 0.96 - 0.45 mg/dL   GFR, Est Non African American 71 > OR = 60 mL/min/1.34m2   GFR, Est African American 83 > OR = 60 mL/min/1.76m2   BUN/Creatinine Ratio NOT APPLICABLE 6 - 22 (calc)   Sodium 138 135 - 146 mmol/L   Potassium 4.8 3.5 - 5.3 mmol/L   Chloride 102 98 - 110 mmol/L   CO2 29 20 - 32 mmol/L   Calcium 9.8 8.6 - 10.3 mg/dL   Total Protein 7.8 6.1 - 8.1 g/dL   Albumin 4.5 3.6 - 5.1 g/dL   Globulin 3.3 1.9 - 3.7 g/dL (calc)   AG Ratio 1.4 1.0 - 2.5 (calc)   Total Bilirubin 0.5 0.2 - 1.2 mg/dL   Alkaline phosphatase (APISO) 93 40 - 115 U/L   AST 21 10 - 35 U/L   ALT 31 9 - 46 U/L  Lipid panel  Result Value Ref Range   Cholesterol 264 (H) <200 mg/dL   HDL 40 (L) >40 mg/dL   Triglycerides 981 (H) <150 mg/dL   LDL Cholesterol (Calc) 166 (H) mg/dL (calc)   Total CHOL/HDL Ratio 6.6 (H) <5.0 (calc)   Non-HDL  Cholesterol (Calc) 224 (H) <130 mg/dL (calc)  Hemoglobin X9J  Result Value Ref Range   Hgb A1c MFr Bld 7.0 (H) <5.7 % of total Hgb   Mean Plasma Glucose 154 (calc)   eAG (mmol/L) 8.5 (calc)  TSH  Result Value Ref Range   TSH 5.25 (H) 0.40 - 4.50 mIU/L  CBC with Differential/Platelet  Result Value Ref Range   WBC 5.5 3.8 - 10.8 Thousand/uL   RBC 5.11 4.20 - 5.80 Million/uL   Hemoglobin 15.9 13.2 - 17.1 g/dL   HCT 47.8 29.5 - 62.1 %   MCV 88.5 80.0 - 100.0 fL  MCH 31.1 27.0 - 33.0 pg   MCHC 35.2 32.0 - 36.0 g/dL   RDW 16.1 09.6 - 04.5 %   Platelets 209 140 - 400 Thousand/uL   MPV 9.9 7.5 - 12.5 fL   Neutro Abs 2,596 1,500 - 7,800 cells/uL   Lymphs Abs 2,321 850 - 3,900 cells/uL   WBC mixed population 501 200 - 950 cells/uL   Eosinophils Absolute 61 15 - 500 cells/uL   Basophils Absolute 22 0 - 200 cells/uL   Neutrophils Relative % 47.2 %   Total Lymphocyte 42.2 %   Monocytes Relative 9.1 %   Eosinophils Relative 1.1 %   Basophils Relative 0.4 %  T4, free  Result Value Ref Range   Free T4 1.7 0.8 - 1.8 ng/dL  PSA, Total with Reflex to PSA, Free  Result Value Ref Range   PSA, Total 0.6 < OR = 4.0 ng/mL  VITAMIN D 25 Hydroxy (Vit-D Deficiency, Fractures)  Result Value Ref Range   Vit D, 25-Hydroxy 23 (L) 30 - 100 ng/mL      Assessment & Plan:   Problem List Items Addressed This Visit    Benign prostatic hyperplasia with nocturia    Significantly improved, chronic BPH with  lower urinary tract symptoms (LUTS) - AUA BPH score improved 5 from 14 - on Tamsulosin - Off Saw Palmetto - Last PSA 0.6 - Last DRE reported mild BPH or enlarged few year ago - No known personal/family history of prostate CA  Plan: 1. Continue Tamsulosin 0.4mg  daily, advised on benefits, risks, if BP low caution with sudden standing up or position change 2. Follow-up as needed, we can consider double dose in future if need vs finasteride      Elevated hemoglobin A1c - Primary    Now  dramatically improved CBGs and lifestyle, feels better Not on meds Recently diagnosed new elevated A1c 7.0, concern for new DM2 dx  Plan:  1. Not on any therapy currently - reviewed may benefit from Metformin defer for now 2. Encourage improved lifestyle - continue improving low carb, low sugar diet, reduce portion size, continue improving regular exercise 3. Follow-up 3 months for A1c lab draw repeat, deferred A1c today since home CBG readings much improved      Elevated TSH    Unlikely to be cause of his tiredness and symptoms No treatment now, likely subclinical Re-check TSH Free T4 in 3 months      Primary insomnia    Improved on alpha blocker for BPH less nocturia Still chronic insomnia Monitor symptoms, since improving Future reconsider medicines for sleep if indicated, defer for now      Tiredness    Significant improved now with improved blood sugar and less nocturia on medicine - Seems multifactorial, still some insomnia Elevated TSH mildly, less likely to be cause Mild low Vit D Not suggestive of OSA, despite daytime sleepiness, neg screening  Plan Continue Flomax for BPH, reduce nocturia Continue improving sleep hygiene Re-check labs in 3 months - A1c, Vit D TSH      Vitamin D deficiency    Continue Vit D3 Repeat check in 3 months         No orders of the defined types were placed in this encounter.    Follow up plan: Return in about 3 months (around 03/25/2018) for Lab review PreDM, TSH, Vit D, Tired.  Future labs ordered for 02/2018 - TSH, Free T4, A1c, Vit D  Saralyn Pilar, DO [facility] 12/23/2017, 2:43 PM

## 2017-12-23 NOTE — Assessment & Plan Note (Signed)
Now dramatically improved CBGs and lifestyle, feels better Not on meds Recently diagnosed new elevated A1c 7.0, concern for new DM2 dx  Plan:  1. Not on any therapy currently - reviewed may benefit from Metformin defer for now 2. Encourage improved lifestyle - continue improving low carb, low sugar diet, reduce portion size, continue improving regular exercise 3. Follow-up 3 months for A1c lab draw repeat, deferred A1c today since home CBG readings much improved

## 2017-12-23 NOTE — Assessment & Plan Note (Signed)
Continue Vit D3 Repeat check in 3 months

## 2018-02-16 ENCOUNTER — Encounter: Payer: Self-pay | Admitting: Family Medicine

## 2018-02-16 ENCOUNTER — Ambulatory Visit: Payer: BLUE CROSS/BLUE SHIELD | Admitting: Family Medicine

## 2018-02-16 VITALS — BP 109/68 | HR 84 | Temp 98.3°F | Resp 16 | Ht 69.0 in | Wt 182.0 lb

## 2018-02-16 DIAGNOSIS — J069 Acute upper respiratory infection, unspecified: Secondary | ICD-10-CM | POA: Diagnosis not present

## 2018-02-16 DIAGNOSIS — B9789 Other viral agents as the cause of diseases classified elsewhere: Secondary | ICD-10-CM

## 2018-02-16 MED ORDER — BENZONATATE 100 MG PO CAPS: 100.0000 mg | ORAL_CAPSULE | Freq: Three times a day (TID) | ORAL | 0 refills | Status: DC | PRN

## 2018-02-16 MED ORDER — IPRATROPIUM BROMIDE 0.06 % NA SOLN: 2.0000 | Freq: Four times a day (QID) | NASAL | 0 refills | Status: DC

## 2018-02-16 NOTE — Progress Notes (Signed)
Subjective:    Patient ID: Chad Gross., male    DOB: Oct 27, 1960, 58 y.o.   MRN: [id number]  Chad Rose. is a 57 y.o. male presenting on 02/16/2018 for Cough (yellowish mucus, SOB, nasal congestion onset yesterday)  Patient presents for a same day appointment.  HPI   VIRAL URI w/ COUGH vs Acute Rhinosinusitis - Reports symptoms started yesterday with generalized "sick feeling" did not feel good but was not too bad he admits non specific symptoms, and now today woke up 0630 and felt acutely ill with coughing, sinus congestion pressure and sore throat and progression to deeper chest congestion and cough. Tried some 12 hr mucinex last night - No recent illnesses similar or no sick contacts - Admits thicker yellow sputum coughing up - Admits some generalized body aches without myalgias - past history of Asthma, has been uncomplicated no regular problem with this, no albuterol inhaler - Denies any fevers chills sweats, nausea vomiting, abdominal pain, rash, wheezing dyspnea, chest pain  Depression screen [facility] 2/9 02/16/2018 12/23/2017 11/25/2017  Decreased Interest 0 0 0  Down, Depressed, Hopeless 0 0 0  PHQ - 2 Score 0 0 0    Social History   Tobacco Use  . Smoking status: Never Smoker  . Smokeless tobacco: Never Used  Substance Use Topics  . Alcohol use: Yes    Alcohol/week: 2.4 oz    Types: 4 Cans of beer per week  . Drug use: No    Review of Systems Per HPI unless specifically indicated above     Objective:    BP 109/68   Pulse 84   Temp 98.3 F (36.8 C) (Oral)   Resp 16   Ht  (1.753 m)   Wt 182 lb (82.6 kg)   SpO2 100%   BMI 26.88 kg/m   Wt Readings from Last 3 Encounters:  02/16/18 182 lb (82.6 kg)  12/23/17 184 lb (83.5 kg)  11/25/17 191 lb (86.6 kg)    Physical Exam  Constitutional: He is oriented to person, place, and time. He appears well-developed and well-nourished. No distress.  Mildly tired-appearing, cooperative  HENT:  Head:  Normocephalic and atraumatic.  Mouth/Throat: Oropharynx is clear and moist.  Frontal / maxillary sinuses non-tender. Nares patent with congestion w/o purulence. Bilateral TMs clear with mild effusion R>L without erythema or bulging. Oropharynx with mild posterior drainage without erythema, exudates, edema or asymmetry.  Eyes: Conjunctivae are normal. Right eye exhibits no discharge. Left eye exhibits no discharge.  Neck: Normal range of motion. Neck supple. No thyromegaly present.  Cardiovascular: Normal rate, regular rhythm, normal heart sounds and intact distal pulses.  No murmur heard. Pulmonary/Chest: Effort normal and breath sounds normal. No respiratory distress. He has no wheezes. He has no rales.  Occasional cough. Slightly reduced air movement, with some tight breathing some mild coarse sounds clear with cough.  Musculoskeletal: Normal range of motion. He exhibits no edema.  Lymphadenopathy:    He has no cervical adenopathy.  Neurological: He is alert and oriented to person, place, and time.  Skin: Skin is warm and dry. No rash noted. He is not diaphoretic. No erythema.  Psychiatric: He has a normal mood and affect. His behavior is normal.  Well groomed, good eye contact, normal speech and thoughts  Nursing note and vitals reviewed.  Results for orders placed or performed in visit on 11/25/17  COMPLETE METABOLIC PANEL WITH GFR  Result Value Ref Range   Glucose, Bld 124 (  H) 65 - 99 mg/dL   BUN 17 7 - 25 mg/dL   Creat 4.78 2.95 - 6.21 mg/dL   GFR, Est Non African American 71 > OR = 60 mL/min/1.85m2   GFR, Est African American 83 > OR = 60 mL/min/1.35m2   BUN/Creatinine Ratio NOT APPLICABLE 6 - 22 (calc)   Sodium 138 135 - 146 mmol/L   Potassium 4.8 3.5 - 5.3 mmol/L   Chloride 102 98 - 110 mmol/L   CO2 29 20 - 32 mmol/L   Calcium 9.8 8.6 - 10.3 mg/dL   Total Protein 7.8 6.1 - 8.1 g/dL   Albumin 4.5 3.6 - 5.1 g/dL   Globulin 3.3 1.9 - 3.7 g/dL (calc)   AG Ratio 1.4 1.0 - 2.5  (calc)   Total Bilirubin 0.5 0.2 - 1.2 mg/dL   Alkaline phosphatase (APISO) 93 40 - 115 U/L   AST 21 10 - 35 U/L   ALT 31 9 - 46 U/L  Lipid panel  Result Value Ref Range   Cholesterol 264 (H) <200 mg/dL   HDL 40 (L) >30 mg/dL   Triglycerides 865 (H) <150 mg/dL   LDL Cholesterol (Calc) 166 (H) mg/dL (calc)   Total CHOL/HDL Ratio 6.6 (H) <5.0 (calc)   Non-HDL Cholesterol (Calc) 224 (H) <130 mg/dL (calc)  Hemoglobin H8I  Result Value Ref Range   Hgb A1c MFr Bld 7.0 (H) <5.7 % of total Hgb   Mean Plasma Glucose 154 (calc)   eAG (mmol/L) 8.5 (calc)  TSH  Result Value Ref Range   TSH 5.25 (H) 0.40 - 4.50 mIU/L  CBC with Differential/Platelet  Result Value Ref Range   WBC 5.5 3.8 - 10.8 Thousand/uL   RBC 5.11 4.20 - 5.80 Million/uL   Hemoglobin 15.9 13.2 - 17.1 g/dL   HCT 69.6 29.5 - 28.4 %   MCV 88.5 80.0 - 100.0 fL   MCH 31.1 27.0 - 33.0 pg   MCHC 35.2 32.0 - 36.0 g/dL   RDW 13.2 44.0 - 10.2 %   Platelets 209 140 - 400 Thousand/uL   MPV 9.9 7.5 - 12.5 fL   Neutro Abs 2,596 1,500 - 7,800 cells/uL   Lymphs Abs 2,321 850 - 3,900 cells/uL   WBC mixed population 501 200 - 950 cells/uL   Eosinophils Absolute 61 15 - 500 cells/uL   Basophils Absolute 22 0 - 200 cells/uL   Neutrophils Relative % 47.2 %   Total Lymphocyte 42.2 %   Monocytes Relative 9.1 %   Eosinophils Relative 1.1 %   Basophils Relative 0.4 %  T4, free  Result Value Ref Range   Free T4 1.7 0.8 - 1.8 ng/dL  PSA, Total with Reflex to PSA, Free  Result Value Ref Range   PSA, Total 0.6 < OR = 4.0 ng/mL  VITAMIN D 25 Hydroxy (Vit-D Deficiency, Fractures)  Result Value Ref Range   Vit D, 25-Hydroxy 23 (L) 30 - 100 ng/mL      Assessment & Plan:   Problem List Items Addressed This Visit    None    Visit Diagnoses    Viral URI with cough    -  Primary   Relevant Medications   benzonatate (TESSALON) 100 MG capsule   ipratropium (ATROVENT) 0.06 % nasal spray      Consistent with viral URI vs acute  rhinosinusitis x 24-48 hours, without known sick contacts Afebrile, well hydrated on exam, no focal signs of infection (ears, throat, lungs clear).  Plan: 1. Reassurance, likely  self-limited with cough lasting up to few weeks - Start Atrovent nasal spray decongestant 2 sprays each nostril up to 4 times daily for 5-7 days - In Future can RESTART - Flonase 2 sprays each nostril daily for up to 4-6 weeks to help prevent or reduce sinus congestion / eustachian tube dysfunction - Defer antibiotic at this time - Start Mucinex-DM OTC up to 7-10 days then stop 2. Supportive care with nasal saline, warm herbal tea with honey, 3. Improve hydration 4. Tylenol / Motrin PRN fevers 5. Return criteria given - return within 48 hours or next week if worsening, concern for cough wheezing dyspnea, w/ childhood history of asthma and lungs today may benefit from prednisone burst or rx albuterol PRN, or if worsening sign infection consider antibiotic   Meds ordered this encounter  Medications  . benzonatate (TESSALON) 100 MG capsule    Sig: Take 1 capsule (100 mg total) by mouth 3 (three) times daily as needed for cough.    Dispense:  30 capsule    Refill:  0  . ipratropium (ATROVENT) 0.06 % nasal spray    Sig: Place 2 sprays into both nostrils 4 (four) times daily. For up to 5-7 days then stop.    Dispense:  15 mL    Refill:  0    Follow up plan: Return in about 1 week (around 02/23/2018), or if symptoms worsen or fail to improve, for URI.  Saralyn Pilar, DO [facility] 02/16/2018, 1:20 PM

## 2018-02-16 NOTE — Patient Instructions (Addendum)
Thank you for coming to the office today.  1. It sounds like you have a Upper Respiratory Virus - this will most likely run it's course in 7 to 10 days. Recommend good hand washing.  Start Atrovent nasal spray decongestant 2 sprays in each nostril up to 4 times daily for 7 days  Start Tessalon Perls take 1 capsule up to 3 times a day as needed for cough  - Drink plenty of fluids to improve congestion - You may try over the counter Nasal Saline spray (Simply Saline, Ocean Spray) as needed to reduce congestion. - Drink warm herbal tea with honey for sore throat - Start taking Tylenol extra strength 1 to 2 tablets every 6-8 hours for aches or fever/chills for next few days as needed.  If symptoms significantly worsening with persistent fevers/chills despite tylenol/ibpurofen, nausea, vomiting unable to tolerate food/fluids or medicine, body aches, or shortness of breath, sinus pain pressure or worsening productive cough, then follow-up for re-evaluation, may seek more immediate care at Urgent Care or ED if more concerned for emergency.  If not improving by 48 hours can call or come back by Fri or Monday - and we can consider alternative therapy for possible bronchospasm or asthma - may need steroid or rescue inhaler, or possibly antibiotic if worsening infection symptoms  For fluid behind ears  Start nasal steroid Flonase 2 sprays in each nostril daily for 4-6 weeks, may repeat course seasonally or as needed  Please schedule a Follow-up Appointment to: Return in about 1 week (around 02/23/2018), or if symptoms worsen or fail to improve, for URI.  If you have any other questions or concerns, please feel free to call the office or send a message through MyChart. You may also schedule an earlier appointment if necessary.  Additionally, you may be receiving a survey about your experience at our office within a few days to 1 week by e-mail or mail. We value your feedback.  Saralyn Pilar,  DO [facility], [place]

## 2018-03-18 ENCOUNTER — Other Ambulatory Visit: Payer: Self-pay

## 2018-03-18 DIAGNOSIS — R7989 Other specified abnormal findings of blood chemistry: Secondary | ICD-10-CM

## 2018-03-18 DIAGNOSIS — E559 Vitamin D deficiency, unspecified: Secondary | ICD-10-CM

## 2018-03-18 DIAGNOSIS — R7309 Other abnormal glucose: Secondary | ICD-10-CM

## 2018-03-21 ENCOUNTER — Other Ambulatory Visit: Payer: BLUE CROSS/BLUE SHIELD

## 2018-03-21 DIAGNOSIS — E559 Vitamin D deficiency, unspecified: Secondary | ICD-10-CM | POA: Diagnosis not present

## 2018-03-21 DIAGNOSIS — E039 Hypothyroidism, unspecified: Secondary | ICD-10-CM | POA: Diagnosis not present

## 2018-03-21 DIAGNOSIS — R7989 Other specified abnormal findings of blood chemistry: Secondary | ICD-10-CM | POA: Diagnosis not present

## 2018-03-21 DIAGNOSIS — R7309 Other abnormal glucose: Secondary | ICD-10-CM | POA: Diagnosis not present

## 2018-03-22 ENCOUNTER — Encounter: Payer: Self-pay | Admitting: Family Medicine

## 2018-03-22 LAB — HEMOGLOBIN A1C
Hgb A1c MFr Bld: 6 %{Hb} — ABNORMAL HIGH
Mean Plasma Glucose: 126 (calc)
eAG (mmol/L): 7 (calc)

## 2018-03-22 LAB — T4, FREE: Free T4: 1.5 ng/dL (ref 0.8–1.8)

## 2018-03-22 LAB — TSH: TSH: 4.65 m[IU]/L — ABNORMAL HIGH (ref 0.40–4.50)

## 2018-03-22 LAB — VITAMIN D 25 HYDROXY (VIT D DEFICIENCY, FRACTURES): Vit D, 25-Hydroxy: 33 ng/mL (ref 30–100)

## 2018-03-25 ENCOUNTER — Ambulatory Visit (INDEPENDENT_AMBULATORY_CARE_PROVIDER_SITE_OTHER): Payer: BLUE CROSS/BLUE SHIELD | Admitting: Family Medicine

## 2018-03-25 ENCOUNTER — Encounter: Payer: Self-pay | Admitting: Family Medicine

## 2018-03-25 VITALS — BP 105/65 | HR 66 | Temp 98.1°F | Resp 16 | Ht 69.0 in | Wt 182.6 lb

## 2018-03-25 DIAGNOSIS — R7303 Prediabetes: Secondary | ICD-10-CM

## 2018-03-25 DIAGNOSIS — G4730 Sleep apnea, unspecified: Secondary | ICD-10-CM | POA: Insufficient documentation

## 2018-03-25 DIAGNOSIS — Z1211 Encounter for screening for malignant neoplasm of colon: Secondary | ICD-10-CM | POA: Diagnosis not present

## 2018-03-25 DIAGNOSIS — R7989 Other specified abnormal findings of blood chemistry: Secondary | ICD-10-CM | POA: Diagnosis not present

## 2018-03-25 DIAGNOSIS — E559 Vitamin D deficiency, unspecified: Secondary | ICD-10-CM

## 2018-03-25 DIAGNOSIS — R29818 Other symptoms and signs involving the nervous system: Secondary | ICD-10-CM | POA: Diagnosis not present

## 2018-03-25 DIAGNOSIS — R5383 Other fatigue: Secondary | ICD-10-CM | POA: Diagnosis not present

## 2018-03-25 DIAGNOSIS — F5101 Primary insomnia: Secondary | ICD-10-CM

## 2018-03-25 NOTE — Patient Instructions (Addendum)
Thank you for coming to the office today.  A1c 6.0, great job, keep up the great work with diet changes!  Thyroid is normal, do not need any medicine.  Vitamin D is improved - may continue Vitamin D3 2,000 iu once daily supplement.  We [facility] stay tuned for apt   Referral to GI for Colonoscopy  [facility]([place]) [address] [place], [place] [address] Main: [phone]  Please schedule a Follow-up Appointment to: Return in about 3 months (around 06/25/2018) for PreDM A1c, f/u Sleep Study / Colonoscopy.  If you have any other questions or concerns, please feel free to call the office or send a message through MyChart. You may also schedule an earlier appointment if necessary.  Additionally, you may be receiving a survey about your experience at our office within a few days to 1 week by e-mail or mail. We value your feedback.  Saralyn PilarAlexander Karamalegos, DO [facility], [place]

## 2018-03-25 NOTE — Assessment & Plan Note (Signed)
Improved Vitamin D now in normal range 33 Continue Vitamin D3 2,000 iu daily maintenance

## 2018-03-25 NOTE — Assessment & Plan Note (Signed)
Still suspect more sleep related, waking up poor sleep, see A&P for suspected sleep apnea - additionally considered primary insomnia still with poor initiation of sleep, will reconsider in future if normal PSG, consider sleep rx meds or more psychotropic meds for sleep

## 2018-03-25 NOTE — Assessment & Plan Note (Signed)
Dramatically improved with reduced hyperglycemia, now A1c 6.0 now with lifestyle changes Diagnosis more accurately Pre-Diabetes   Plan:  1. Not on any therapy currently - future consider Metformin 2. Encourage improved lifestyle - continue improving low carb, low sugar diet, reduce portion size, continue improving regular exercise 3. Follow-up 3 months A1c trend

## 2018-03-25 NOTE — Assessment & Plan Note (Signed)
Likely multifactorial, has improved with better diet and reduced A1c, improve Vitamin D, and normal TSH but still not sleeping well likely cause See A&P suspected sleep apnea

## 2018-03-25 NOTE — Progress Notes (Signed)
Subjective:    Patient ID: Chad Grosslaude N Corvera Jr., male    DOB: May 03, 1961, 57 y.o.   MRN: [id number]  Chad GrossClaude N Glab Jr. is a 57 y.o. male presenting on 03/25/2018 for prediabetes   HPI   Elevated A1c / Pre-Diabetes Recent trend with A1c up to 7.0 on prior check at risk for DM2, then lifestyle overhaul and he has dramatically improved his diet and lifestyle, some weight loss, and repeat check A1c down to 6.0. Now consistent with Pre-Diabetes - Today reports feeling better, doing well on improved lifestyle - Has more energy, see below - Denies polyuria, hypoglycemia, numbness tingling  FOLLOW-UP Tiredness / Excessive Daytime / Insomnia / Suspected Sleep Apnea / Vitamin D Deficiency Last visit 11/2017, interval update patient was doing better after improved blood sugar, and had slightly elevated TSH, and Vitamin D was low, he was treated with lifestyle intervention, Vitamin D supplement, sleep hygiene. - Today now again feels improved overall, he has more energy feels better - Taking Vitamin D3 daily - Regarding insomnia, still has problem falling asleep and "shutting mind down" he describes not sleeping well, often still cannot break habit of watching TV before bed or TV in bedroom, or will wake up and go sleep on couch, he wakes up 3-4 times overnight still urinate, he has air machine for background noise - Additional update - remains improved urinary status with new dx BPH and on flomax since 10/2017. He has less urinary symptoms during day but still waking up about 3 times overnight to urinate - No prior history of OSA, we discussed this before, now he is more concerned about it and asking about sleep study - Still working a lot and has work stress  Epworth Sleepiness Scale Total Score: 11 Sitting and reading - 3 Watching TV - 3 Sitting inactive in a public place - 2 As a passenger in a car for an hour without a break - 1 Lying down to rest in the afternoon when circumstances permit -  2 Sitting and talking to someone - 0 Sitting quietly after a lunch without alcohol - 0 In a car, while stopped for a few minutes in traffic - 0  STOP-Bang OSA scoring Snoring yes   Tiredness yes   Observed apneas no   Pressure HTN no   BMI > 35 kg/m2 no   Age > 50  yes   Neck (male >17 in; Male >16 in)  yes 4117.5"  Gender male yes   OSA risk low (0-2)  OSA risk intermediate (3-4)  OSA risk high (5+)  Total: 5 (High Risk)    Health Maintenance:  Colon CA Screening: Never had colonoscopy. Currently asymptomatic. No known family history of colon CA. Due for screening test age 67>50, referral sent to [facility] [place] by his request to proceed with Colonoscopy.   [facility] 2/9 02/16/2018 12/23/2017 11/25/2017  Decreased Interest 0 0 0  Down, Depressed, Hopeless 0 0 0  PHQ - 2 Score 0 0 0    Social History   Tobacco Use  . Smoking status: Never Smoker  . Smokeless tobacco: Never Used  Substance Use Topics  . Alcohol use: Yes    Alcohol/week: 2.4 oz    Types: 4 Cans of beer per week  . Drug use: No    Review of Systems Per HPI unless specifically indicated above     Objective:    BP 105/65   Pulse 66   Temp 98.1 F (36.7  C) (Oral)   Resp 16   Ht 5\' 9"  (1.753 m)   Wt 182 lb 9.6 oz (82.8 kg)   BMI 26.97 kg/m   Wt Readings from Last 3 Encounters:  03/25/18 182 lb 9.6 oz (82.8 kg)  02/16/18 182 lb (82.6 kg)  12/23/17 184 lb (83.5 kg)    Physical Exam  Constitutional: He is oriented to person, place, and time. He appears well-developed and well-nourished. No distress.  Well-appearing, comfortable, cooperative  HENT:  Head: Normocephalic and atraumatic.  Mouth/Throat: Oropharynx is clear and moist.  Eyes: Conjunctivae are normal. Right eye exhibits no discharge. Left eye exhibits no discharge.  Cardiovascular: Normal rate.  Pulmonary/Chest: Effort normal.  Musculoskeletal: He exhibits no edema.  Neurological: He is alert and oriented to person, place, and  time.  Skin: Skin is warm and dry. No rash noted. He is not diaphoretic. No erythema.  Psychiatric: He has a normal mood and affect. His behavior is normal.  Well groomed, good eye contact, normal speech and thoughts  Nursing note and vitals reviewed.  Results for orders placed or performed in visit on 03/18/18  VITAMIN D 25 Hydroxy (Vit-D Deficiency, Fractures)  Result Value Ref Range   Vit D, 25-Hydroxy 33 30 - 100 ng/mL  T4, free  Result Value Ref Range   Free T4 1.5 0.8 - 1.8 ng/dL  TSH  Result Value Ref Range   TSH 4.65 (H) 0.40 - 4.50 mIU/L  Hemoglobin A1c  Result Value Ref Range   Hgb A1c MFr Bld 6.0 (H) <5.7 % of total Hgb   Mean Plasma Glucose 126 (calc)   eAG (mmol/L) 7.0 (calc)      Assessment & Plan:   Problem List Items Addressed This Visit    Elevated TSH    Improved TSH again, nearly normal Unlikely factor Follow-up in future      Pre-diabetes    Dramatically improved with reduced hyperglycemia, now A1c 6.0 now with lifestyle changes Diagnosis more accurately Pre-Diabetes   Plan:  1. Not on any therapy currently - future consider Metformin 2. Encourage improved lifestyle - continue improving low carb, low sugar diet, reduce portion size, continue improving regular exercise 3. Follow-up 3 months A1c trend      Primary insomnia    Still suspect more sleep related, waking up poor sleep, see A&P for suspected sleep apnea - additionally considered primary insomnia still with poor initiation of sleep, will reconsider in future if normal PSG, consider sleep rx meds or more psychotropic meds for sleep      Suspected sleep apnea - Primary    Persistent clinical concern for suspected sleep disorder possibly obstructive sleep apnea given reported symptoms with sleep disturbance, fatigue excessive sleepiness. - Screening: ESS score 11 / STOP-Bang Score 5 high risk - Neck Circumference: 17.5" - Co-morbidities: None  Plan: 1. Discussion on initial diagnosis and  testing for OSA, risk factors, management, complications 2. Agree to proceed with sleep study testing based on clinical concerns - order [facility]ed to [facility], patient to be notified home vs sleep center / ins coverage - f/u pending testing      Tiredness    Likely multifactorial, has improved with better diet and reduced A1c, improve Vitamin D, and normal TSH but still not sleeping well likely cause See A&P suspected sleep apnea       Vitamin D deficiency    Improved Vitamin D now in normal range 33 Continue Vitamin D3 2,000 iu daily maintenance  Other Visit Diagnoses    Screening for colon cancer       Relevant Orders   Ambulatory referral to Gastroenterology      No orders of the defined types were placed in this encounter.   Follow up plan: Return in about 3 months (around 06/25/2018) for PreDM A1c, f/u Sleep Study / Colonoscopy.  Saralyn Pilar, DO [facility] 03/25/2018, 1:03 PM

## 2018-03-25 NOTE — Assessment & Plan Note (Signed)
Persistent clinical concern for suspected sleep disorder possibly obstructive sleep apnea given reported symptoms with sleep disturbance, fatigue excessive sleepiness. - Screening: ESS score 11 / STOP-Bang Score 5 high risk - Neck Circumference: 17.5" - Co-morbidities: None  Plan: 1. Discussion on initial diagnosis and testing for OSA, risk factors, management, complications 2. Agree to proceed with sleep study testing based on clinical concerns - [facility], patient to be notified home vs sleep center / ins coverage - f/u pending testing

## 2018-03-25 NOTE — Assessment & Plan Note (Signed)
Improved TSH again, nearly normal Unlikely factor Follow-up in future

## 2018-03-30 ENCOUNTER — Other Ambulatory Visit: Payer: Self-pay

## 2018-03-30 DIAGNOSIS — Z1211 Encounter for screening for malignant neoplasm of colon: Secondary | ICD-10-CM

## 2018-04-11 ENCOUNTER — Encounter: Payer: Self-pay | Admitting: *Deleted

## 2018-04-11 ENCOUNTER — Other Ambulatory Visit: Payer: Self-pay

## 2018-04-20 NOTE — Discharge Instructions (Signed)
General Anesthesia, Adult, Care After °These instructions provide you with information about caring for yourself after your procedure. Your health care provider may also give you more specific instructions. Your treatment has been planned according to current medical practices, but problems sometimes occur. Call your health care provider if you have any problems or questions after your procedure. °What can I expect after the procedure? °After the procedure, it is common to have: °· Vomiting. °· A sore throat. °· Mental slowness. ° °It is common to feel: °· Nauseous. °· Cold or shivery. °· Sleepy. °· Tired. °· Sore or achy, even in parts of your body where you did not have surgery. ° °Follow these instructions at home: °For at least 24 hours after the procedure: °· Do not: °? Participate in activities where you could fall or become injured. °? Drive. °? Use heavy machinery. °? Drink alcohol. °? Take sleeping pills or medicines that cause drowsiness. °? Make important decisions or sign legal documents. °? Take care of children on your own. °· Rest. °Eating and drinking °· If you vomit, drink water, juice, or soup when you can drink without vomiting. °· Drink enough fluid to keep your urine clear or pale yellow. °· Make sure you have little or no nausea before eating solid foods. °· Follow the diet recommended by your health care provider. °General instructions °· Have a responsible adult stay with you until you are awake and alert. °· Return to your normal activities as told by your health care provider. Ask your health care provider what activities are safe for you. °· Take over-the-counter and prescription medicines only as told by your health care provider. °· If you smoke, do not smoke without supervision. °· Keep all follow-up visits as told by your health care provider. This is important. °Contact a health care provider if: °· You continue to have nausea or vomiting at home, and medicines are not helpful. °· You  cannot drink fluids or start eating again. °· You cannot urinate after 8-12 hours. °· You develop a skin rash. °· You have fever. °· You have increasing redness at the site of your procedure. °Get help right away if: °· You have difficulty breathing. °· You have chest pain. °· You have unexpected bleeding. °· You feel that you are having a life-threatening or urgent problem. °This information is not intended to replace advice given to you by your health care provider. Make sure you discuss any questions you have with your health care provider. °Document Released: 12/21/2000 Document Revised: 02/17/2016 Document Reviewed: 08/29/2015 °[employer]vier Interactive Patient Education © 2018 [employer] ° °

## 2018-04-21 ENCOUNTER — Ambulatory Visit: Payer: BLUE CROSS/BLUE SHIELD | Admitting: Anesthesiology

## 2018-04-21 ENCOUNTER — Encounter
Admission: RE | Disposition: A | Discharge status: Home / Self Care | Payer: Self-pay | Source: Ambulatory Visit | Attending: Gastroenterology

## 2018-04-21 ENCOUNTER — Ambulatory Visit
Admission: RE | Admit: 2018-04-21 | Discharge: 2018-04-21 | Disposition: A | Discharge status: Home / Self Care | Payer: BLUE CROSS/BLUE SHIELD | Source: Ambulatory Visit | Attending: Gastroenterology | Admitting: Gastroenterology

## 2018-04-21 DIAGNOSIS — K635 Polyp of colon: Secondary | ICD-10-CM | POA: Diagnosis not present

## 2018-04-21 DIAGNOSIS — Z79899 Other long term (current) drug therapy: Secondary | ICD-10-CM | POA: Insufficient documentation

## 2018-04-21 DIAGNOSIS — Z1211 Encounter for screening for malignant neoplasm of colon: Secondary | ICD-10-CM | POA: Diagnosis not present

## 2018-04-21 DIAGNOSIS — J45909 Unspecified asthma, uncomplicated: Secondary | ICD-10-CM | POA: Diagnosis not present

## 2018-04-21 DIAGNOSIS — K621 Rectal polyp: Secondary | ICD-10-CM | POA: Diagnosis not present

## 2018-04-21 DIAGNOSIS — Z800 Family history of malignant neoplasm of digestive organs: Secondary | ICD-10-CM | POA: Insufficient documentation

## 2018-04-21 DIAGNOSIS — K640 First degree hemorrhoids: Secondary | ICD-10-CM | POA: Insufficient documentation

## 2018-04-21 DIAGNOSIS — R7303 Prediabetes: Secondary | ICD-10-CM | POA: Insufficient documentation

## 2018-04-21 HISTORY — DX: Prediabetes: R73.03

## 2018-04-21 HISTORY — PX: COLONOSCOPY WITH PROPOFOL: SHX5780

## 2018-04-21 HISTORY — PX: POLYPECTOMY: SHX5525

## 2018-04-21 LAB — GLUCOSE, CAPILLARY: Glucose-Capillary: 100 mg/dL — ABNORMAL HIGH (ref 70–99)

## 2018-04-21 SURGERY — COLONOSCOPY WITH PROPOFOL
Anesthesia: General

## 2018-04-21 MED ORDER — LACTATED RINGERS IV SOLN
INTRAVENOUS | Status: DC
  Administered 2018-04-21: 09:00:00 via INTRAVENOUS

## 2018-04-21 MED ORDER — FENTANYL CITRATE (PF) 100 MCG/2ML IJ SOLN: 25.0000 ug | INTRAMUSCULAR | Status: DC | PRN

## 2018-04-21 MED ORDER — LACTATED RINGERS IV SOLN: INTRAVENOUS | Status: DC

## 2018-04-21 MED ORDER — PROMETHAZINE HCL 25 MG/ML IJ SOLN: 6.2500 mg | INTRAMUSCULAR | Status: DC | PRN

## 2018-04-21 MED ORDER — STERILE WATER FOR IRRIGATION IR SOLN
Status: DC | PRN
  Administered 2018-04-21: 09:00:00

## 2018-04-21 MED ORDER — OXYCODONE HCL 5 MG PO TABS: 5.0000 mg | ORAL_TABLET | Freq: Once | ORAL | Status: DC | PRN

## 2018-04-21 MED ORDER — LIDOCAINE HCL (CARDIAC) PF 100 MG/5ML IV SOSY
PREFILLED_SYRINGE | INTRAVENOUS | Status: DC | PRN
  Administered 2018-04-21: 30 mg via INTRAVENOUS

## 2018-04-21 MED ORDER — PROPOFOL 10 MG/ML IV BOLUS
INTRAVENOUS | Status: DC | PRN
  Administered 2018-04-21 (×2): 40 mg via INTRAVENOUS
  Administered 2018-04-21: 100 mg via INTRAVENOUS
  Administered 2018-04-21: 20 mg via INTRAVENOUS
  Administered 2018-04-21: 40 mg via INTRAVENOUS

## 2018-04-21 MED ORDER — OXYCODONE HCL 5 MG/5ML PO SOLN: 5.0000 mg | Freq: Once | ORAL | Status: DC | PRN

## 2018-04-21 SURGICAL SUPPLY — 24 items

## 2018-04-21 NOTE — H&P (Signed)
Lucilla Lame, MD [facility] [address], [place], [place] [address] Phone: [phone] Fax : [phone]  Primary Care Physician:  Olin Hauser, DO Primary Gastroenterologist:  Dr. Allen Norris  Pre-Procedure History & Physical: HPI:  Chad Rose. is a 57 y.o. male is here for a screening colonoscopy.   Past Medical History:  Diagnosis Date  . Asthma   . Pre-diabetes     Past Surgical History:  Procedure Laterality Date  . BACK SURGERY      Prior to Admission medications   Medication Sig Start Date End Date Taking? Authorizing Provider  Cholecalciferol (VITAMIN D PO) Take by mouth daily.   Yes [provider]  ipratropium (ATROVENT) 0.06 % nasal spray Place 2 sprays into both nostrils 4 (four) times daily. For up to 5-7 days then stop. 02/16/18  Yes Karamalegos, Devonne Doughty, DO  Multiple Vitamin (MULTIVITAMIN) capsule Take 1 capsule by mouth daily.   Yes [provider]  naproxen sodium (ALEVE) 220 MG tablet Take 220 mg by mouth.   Yes [provider]  Specialty Vitamins Products (PROSTATE PO) Take by mouth.   Yes [provider]  tamsulosin (FLOMAX) 0.4 MG CAPS capsule Take 1 capsule (0.4 mg total) by mouth daily. 11/25/17  Yes Karamalegos, Devonne Doughty, DO  Blood Glucose Monitoring Suppl (CONTOUR NEXT EZ) w/Device KIT 1 kit by Does not apply route 2 (two) times daily. 12/15/17   Parks Ranger, Devonne Doughty, DO  glucose blood test strip Use as instructed Patient not taking: Reported on 03/25/2018 12/15/17   Olin Hauser, DO    Allergies as of 03/30/2018  . (No Known Allergies)    Family History  Problem Relation Age of Onset  . Throat cancer Mother   . Alcohol abuse Father   . Cirrhosis Father   . Pneumonia Father   . Diabetes Neg Hx   . Prostate cancer Neg Hx   . Colon cancer Neg Hx     Social History   Socioeconomic History  . Marital status: Married    Spouse name: Not on file  . Number of children:  3  . Years of education: [employer]  . Highest education level: High school graduate  Occupational History  . Occupation: Musician    Comment: [place] / [place]  Social Needs  . Financial resource strain: Not on file  . Food insecurity:    Worry: Not on file    Inability: Not on file  . Transportation needs:    Medical: Not on file    Non-medical: Not on file  Tobacco Use  . Smoking status: Never Smoker  . Smokeless tobacco: Never Used  Substance and Sexual Activity  . Alcohol use: Yes    Alcohol/week: 3.0 oz    Types: 5 Cans of beer per week  . Drug use: No  . Sexual activity: Not on file  Lifestyle  . Physical activity:    Days per week: Not on file    Minutes per session: Not on file  . Stress: Not on file  Relationships  . Social connections:    Talks on phone: Not on file    Gets together: Not on file    Attends religious service: Not on file    Active member of club or organization: Not on file    Attends meetings of clubs or organizations: Not on file    Relationship status: Not on file  . Intimate partner violence:    Fear of  current or ex partner: Not on file    Emotionally abused: Not on file    Physically abused: Not on file    Forced sexual activity: Not on file  Other Topics Concern  . Not on file  Social History Narrative  . Not on file    Review of Systems: See HPI, otherwise negative ROS  Physical Exam: BP 130/86   Pulse 80   Temp 97.8 F (36.6 C) (Temporal)   Ht 5' 9"  (1.753 m)   Wt 175 lb (79.4 kg)   SpO2 99%   BMI 25.84 kg/m  General:   Alert,  pleasant and cooperative in NAD Head:  Normocephalic and atraumatic. Neck:  Supple; no masses or thyromegaly. Lungs:  Clear throughout to auscultation.    Heart:  Regular rate and rhythm. Abdomen:  Soft, nontender and nondistended. Normal bowel sounds, without guarding, and without rebound.   Neurologic:  Alert and  oriented x4;  grossly normal  neurologically.  Impression/Plan: [name] is now here to undergo a screening colonoscopy.  Risks, benefits, and alternatives regarding colonoscopy have been reviewed with the patient.  Questions have been answered.  All parties agreeable.

## 2018-04-21 NOTE — Op Note (Signed)
[facility] Gastroenterology Patient Name: Chad Rose Procedure Date: 04/21/2018 8:55 AM MRN: [id number] Account #: [id number] Date of Birth: 05-Aug-1961 Admit Type: Outpatient Age: 57 Room: [facility] OR ROOM 01 Gender: Male Note Status: Finalized Procedure:            Colonoscopy Indications:          Screening for colorectal malignant neoplasm Providers:            Midge Minium MD, MD Referring MD:         Smitty Cords (Referring MD) Medicines:            Propofol per Anesthesia Complications:        No immediate complications. Procedure:            Pre-Anesthesia Assessment:                       - Prior to the procedure, a History and Physical was                        performed, and patient medications and allergies were                        reviewed. The patient's tolerance of previous                        anesthesia was also reviewed. The risks and benefits of                        the procedure and the sedation options and risks were                        discussed with the patient. All questions were                        answered, and informed consent was obtained. Prior                        Anticoagulants: The patient has taken no previous                        anticoagulant or antiplatelet agents. ASA Grade                        Assessment: II - A patient with mild systemic disease.                        After reviewing the risks and benefits, the patient was                        deemed in satisfactory condition to undergo the                        procedure.                       After obtaining informed consent, the colonoscope was                        passed under direct vision. Throughout the procedure,  the patient's blood pressure, pulse, and oxygen                        saturations were monitored continuously. The was                        introduced through the anus and advanced to the the                      cecum, identified by appendiceal orifice and ileocecal                        valve. The colonoscopy was performed without                        difficulty. The patient tolerated the procedure well.                        The quality of the bowel preparation was excellent. Findings:      The perianal and digital rectal examinations were normal.      Five sessile polyps were found in the rectum. The polyps were 2 to 3 mm       in size. These polyps were removed with a cold biopsy forceps. Resection       and retrieval were complete.      Non-bleeding internal hemorrhoids were found during retroflexion. The       hemorrhoids were Grade I (internal hemorrhoids that do not prolapse). Impression:           - Five 2 to 3 mm polyps in the rectum, removed with a                        cold biopsy forceps. Resected and retrieved.                       - Non-bleeding internal hemorrhoids. Recommendation:       - Discharge patient to home.                       - Resume previous diet.                       - Continue present medications.                       - Await pathology results.                       - Repeat colonoscopy in 5 years if polyp adenoma and 10                        years if hyperplastic Procedure Code(s):    --- Professional ---                       [phone], Colonoscopy, flexible; with biopsy, single or                        multiple Diagnosis Code(s):    --- Professional ---                       Z12.11, Encounter for screening  for malignant neoplasm                        of colon                       K62.1, Rectal polyp CPT copyright 2017 American Medical Association. All rights reserved. The codes documented in this report are preliminary and upon coder review may  be revised to meet current compliance requirements. Midge Minium MD, MD 04/21/2018 9:16:50 AM This report has been signed electronically. Number of Addenda: 0 Note Initiated On: 04/21/2018  8:55 AM Scope Withdrawal Time: 0 hours 6 minutes 50 seconds  Total Procedure Duration: 0 hours 11 minutes 28 seconds       [facility]

## 2018-04-21 NOTE — Anesthesia Preprocedure Evaluation (Signed)
Anesthesia Evaluation  Patient identified by MRN, date of birth, ID band Patient awake    Reviewed: Allergy & Precautions, NPO status , Patient's Chart, lab work & pertinent test results  Airway Mallampati: II  TM Distance: >3 FB Neck ROM: Full    Dental no notable dental hx.    Pulmonary asthma ,    Pulmonary exam normal breath sounds clear to auscultation       Cardiovascular negative cardio ROS Normal cardiovascular exam Rhythm:Regular Rate:Normal     Neuro/Psych negative neurological ROS  negative psych ROS   GI/Hepatic negative GI ROS, Neg liver ROS,   Endo/Other  negative endocrine ROSPre diabetes  Renal/GU negative Renal ROS  negative genitourinary   Musculoskeletal negative musculoskeletal ROS (+)   Abdominal   Peds negative pediatric ROS (+)  Hematology negative hematology ROS (+)   Anesthesia Other Findings   Reproductive/Obstetrics negative OB ROS                             Anesthesia Physical Anesthesia Plan  ASA: II  Anesthesia Plan: General   Post-op Pain Management:    Induction: Intravenous  PONV Risk Score and Plan:   Airway Management Planned: Natural Airway  Additional Equipment:   Intra-op Plan:   Post-operative Plan: Extubation in OR  Informed Consent: I have reviewed the patients History and Physical, chart, labs and discussed the procedure including the risks, benefits and alternatives for the proposed anesthesia with the patient or authorized representative who has indicated his/her understanding and acceptance.   Dental advisory given  Plan Discussed with: CRNA  Anesthesia Plan Comments: (General IVA with natural airway)        Anesthesia Quick Evaluation

## 2018-04-21 NOTE — Transfer of Care (Signed)
Immediate Anesthesia Transfer of Care Note  Patient: Chad GrossClaude N Kloepfer Jr.  Procedure(s) Performed: COLONOSCOPY WITH PROPOFOL (N/A ) POLYPECTOMY  Patient Location: PACU  Anesthesia Type: General  Level of Consciousness: awake, alert  and patient cooperative  Airway and Oxygen Therapy: Patient Spontanous Breathing and Patient connected to supplemental oxygen  Post-op Assessment: Post-op Vital signs reviewed, Patient's Cardiovascular Status Stable, Respiratory Function Stable, Patent Airway and No signs of Nausea or vomiting  Post-op Vital Signs: Reviewed and stable  Complications: No apparent anesthesia complications

## 2018-04-21 NOTE — Anesthesia Postprocedure Evaluation (Signed)
Anesthesia Post Note  Patient: Chad GrossClaude N Rothschild Jr.  Procedure(s) Performed: COLONOSCOPY WITH PROPOFOL (N/A ) POLYPECTOMY  Patient location during evaluation: PACU Anesthesia Type: General Level of consciousness: awake and alert Pain management: pain level controlled Vital Signs Assessment: post-procedure vital signs reviewed and stable Respiratory status: spontaneous breathing, nonlabored ventilation, respiratory function stable and patient connected to nasal cannula oxygen Cardiovascular status: blood pressure returned to baseline and stable Postop Assessment: no apparent nausea or vomiting Anesthetic complications: no    Triniti Gruetzmacher C

## 2018-04-21 NOTE — Anesthesia Procedure Notes (Signed)
Performed by: Melynda Krzywicki, CRNA Pre-anesthesia Checklist: Patient identified, Emergency Drugs available, Suction available, Timeout performed and Patient being monitored Patient Re-evaluated:Patient Re-evaluated prior to induction Oxygen Delivery Method: Nasal cannula Placement Confirmation: positive ETCO2       

## 2018-04-22 ENCOUNTER — Encounter: Payer: Self-pay | Admitting: Gastroenterology

## 2018-04-25 ENCOUNTER — Encounter: Payer: Self-pay | Admitting: Gastroenterology

## 2018-04-26 ENCOUNTER — Encounter: Payer: Self-pay | Admitting: Gastroenterology

## 2018-05-03 DIAGNOSIS — G4730 Sleep apnea, unspecified: Secondary | ICD-10-CM | POA: Diagnosis not present

## 2018-05-05 ENCOUNTER — Encounter: Payer: Self-pay | Admitting: Family Medicine

## 2018-05-31 ENCOUNTER — Other Ambulatory Visit: Payer: Self-pay

## 2018-05-31 ENCOUNTER — Ambulatory Visit: Payer: BLUE CROSS/BLUE SHIELD | Admitting: Nurse Practitioner

## 2018-05-31 ENCOUNTER — Encounter: Payer: Self-pay | Admitting: Nurse Practitioner

## 2018-05-31 VITALS — BP 110/64 | HR 68 | Ht 69.0 in | Wt 186.2 lb

## 2018-05-31 DIAGNOSIS — T63441A Toxic effect of venom of bees, accidental (unintentional), initial encounter: Secondary | ICD-10-CM

## 2018-05-31 DIAGNOSIS — M792 Neuralgia and neuritis, unspecified: Secondary | ICD-10-CM | POA: Diagnosis not present

## 2018-05-31 MED ORDER — GABAPENTIN 100 MG PO CAPS: ORAL_CAPSULE | ORAL | 3 refills | Status: DC

## 2018-05-31 MED ORDER — PREDNISONE 20 MG PO TABS: ORAL_TABLET | ORAL | 0 refills | Status: DC

## 2018-05-31 MED ORDER — GABAPENTIN 100 MG PO CAPS: 100.0000 mg | ORAL_CAPSULE | Freq: Three times a day (TID) | ORAL | 3 refills | Status: DC

## 2018-05-31 NOTE — Progress Notes (Signed)
Subjective:    Patient ID: Chad Gross., male    DOB: 1961/09/19, 57 y.o.   MRN: [id number]  Chad Welton. is a 57 y.o. male presenting on 05/31/2018 for Neck Pain (constant left side throbbing neck pain that radiates down shoulder and arm. The pt seems to think it's a pinch nerve, because the left arm goes numb every time he stands up. x 5 days ) and Insect Bite (pt was stung by a bee x3 days ago)   HPI Left neck/thoracic spine/arm pain Patient presents today with 5-day course of worsening upper back/neck and radiating shoulder/arm pain.  Patient first noticed difficulty approximately 2 to 3 months ago with his arm going numb while driving and resting his left arm on his door.  5 days ago, patient noticed that his arm is beginning to get numb when he stands.  He also was having intermittent pain with this.  Currently, patient is having constant pain while sitting or numbness while standing.  He has had no new actions/injuries over the last week.  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 -Patient has history of back pain and spinal surgeries.  Previous spinal surgeries were performed by Dr. Lowella Petties with [facility].  The surgeries involve patient's lumbar spine. - Normally pain is managed well with Aleve twice daily.  Currently, Aleve are not doing anything for pain relief.   Bee Sting Occurred Saturday (3 days ago),  The sting site Is beginning to get more red.  Pain is masked by neck/arm pain.  Has little concern about this, but "wanted to get it looked at."  Social History   Tobacco Use  . Smoking status: Never Smoker  . Smokeless tobacco: Never Used  Substance Use Topics  . Alcohol use: Yes    Alcohol/week: 5.0 standard drinks    Types: 5 Cans of beer per week  . Drug use: No    Review of Systems Per HPI unless specifically indicated above     Objective:    BP 110/64 (BP  Location: Right Arm, Patient Position: Sitting, Cuff Size: Normal)   Pulse 68   Ht 5\' 9"  (1.753 m)   Wt 186 lb 3.2 oz (84.5 kg)   BMI 27.50 kg/m   Wt Readings from Last 3 Encounters:  05/31/18 186 lb 3.2 oz (84.5 kg)  04/21/18 175 lb (79.4 kg)  03/25/18 182 lb 9.6 oz (82.8 kg)    Physical Exam  Constitutional: He is oriented to person, place, and time. He appears well-developed and well-nourished. No distress.  HENT:  Head: Normocephalic and atraumatic.  Musculoskeletal:       Left shoulder: Normal.  C-Spine Inspection: Normal appearance, normal body habitus, no spinal deformity, symmetrical. Palpation: No tenderness over spinous processes.  Left-sided cervical and upper thoracic paraspinal muscles tender and with hypertonicity/spasm.  Bilateral trapezius muscles non-tender and without hypertonicity/spasm ROM: Full active ROM forward flex / neck extension, rotation L/R without discomfort, Lateral bending L/R without discomfort Special Testing: Spurling's test negative for pain, pincer grasp normal strength.  Facet load test negative. Strength: Bilateral shoulder flex/ext 5/5, elbow flex/ext 5/5, wrist dorsiflex/plantarflex 5/5 Neurovascular: intact distal sensation to light touch  Neurological: He is alert and oriented to person, place, and time.  Skin: Skin is warm and dry. Capillary refill takes less than 2 seconds.     Psychiatric: He has a normal mood and  affect. His behavior is normal.  Vitals reviewed.   Results for orders placed or performed during the hospital encounter of 04/21/18  Glucose, capillary  Result Value Ref Range   Glucose-Capillary 100 (H) 70 - 99 mg/dL      Assessment & Plan:   Problem List Items Addressed This Visit    None    Visit Diagnoses    Radicular pain in left arm    -  Primary   Relevant Medications   predniSONE (DELTASONE) 20 MG tablet   gabapentin (NEURONTIN) 100 MG capsule   Bee sting, accidental or unintentional, initial encounter           Pain worsening over last 3-5 days.  Possible spinal nerve involvement/compression with radicular pain.  Cannot exclude bulging disc.  Unknown specific sites of prior spinal surgeries.  Patient denies any prior cervical or thoracic involvement.    Plan:  1. Treat with steroid: - Start prednisone taper over 10 days. Take prednisone taper 20 mg tablets Day 1-4 take 3 pills at one time; Day 5-6: Take 2 pills; Day 7-8: Take 1 pills; Day 9-10: Take 1/2 pill; then stop. - May take acetaminophen only during prednisone. - After prednisone OTC pain meds (acetaminophen and ibuprofen).  Discussed alternate dosing and max dosing. 2. Apply heat and/or ice to affected area. 3. May also apply a muscle rub with lidocaine or lidocaine patch after heat or ice. 4. Take gabapentin 100 mg capsule.  Take 1-2 capsules at bedtime for radicular neuropathic pain.  May add daytime doses if no drowsiness.   5. Discussed neurosurgery referral vs physical therapy.  Patient currently declines these.  6. Follow up 4-6 weeks   Bee Sting Resolving.  Cannot completely exclude possibility of cellulitis development.  Currently uncomplicated with mild erythema on exam.  Call clinic in next 5-7 days prn for worsening wound.    Meds ordered this encounter  Medications  . predniSONE (DELTASONE) 20 MG tablet    Sig: Day 1-4 take 3 pills once daily.  Day 5-6 take 2 pills.  Day 7-8 take 1 pill.  Day 9-10 take 1/2 pill.    Dispense:  19 tablet    Refill:  0    Order Specific Question:   Supervising Provider    Answer:   Smitty Cords [[id number]]  . DISCONTD: gabapentin (NEURONTIN) 100 MG capsule    Sig: Take 1 capsule (100 mg total) by mouth 3 (three) times daily.    Dispense:  90 capsule    Refill:  3    Order Specific Question:   Supervising Provider    Answer:   Smitty Cords [[id number]]  . gabapentin (NEURONTIN) 100 MG capsule    Sig: Take gabapentin 100-300 mg (1-3) capsules at bedtime as needed for  nerve pain.    Dispense:  90 capsule    Refill:  3    Order Specific Question:   Supervising Provider    Answer:   Smitty Cords [[id number]]    Follow up plan: Return 3-5 days if symptoms worsen or fail to improve.  Wilhelmina Mcardle, DNP, AGPCNP-BC Adult Gerontology Primary Care Nurse Practitioner [facility] 05/31/2018, 2:42 PM

## 2018-05-31 NOTE — Patient Instructions (Addendum)
[name],   Thank you for coming in to clinic today.  1. You have a  - Start taking Tylenol extra strength 1 to 2 tablets every 6-8 hours for aches or fever/chills for next few days as needed.  Do not take more than 3,000 mg in 24 hours from all medicines.   - After prednisone may take Aleeve 440 mg twice daily or Ibuprofen as well if tolerated 200-400mg  every 8 hours as needed. May alternate tylenol and ibuprofen in same day. - Take prednisone taper 20 mg tablets Day 1-4 (Today): Take 3 pills at one time Day 5-6: Take 2 pills  Day 7-8: Take 1 pills Day 9-10: Take 1/2 pill then stop.  - Use heat and ice.  Apply this for 15 minutes at a time 6-8 times per day.   - Muscle rub with lidocaine, lidocaine patch, Biofreeze, or tiger balm for topical pain relief.  Avoid using this with heat and ice to avoid burns.  2. Take gabapentin 100 mg tablets.  Take 1-3 tablets at bedtime as needed for nerve pain.  May take gabapentin in daytime if it does not make you sleepy.   Please schedule a follow-up appointment with Wilhelmina Mcardle, AGNP. Return 3-5 days if symptoms worsen or fail to improve.  If you have any other questions or concerns, please feel free to call the clinic or send a message through MyChart. You may also schedule an earlier appointment if necessary.  You will receive a survey after today's visit either digitally by e-mail or paper by [employer]. Your experiences and feedback matter to [place].  Please respond so we know how we are doing as we provide care for you.   Wilhelmina Mcardle, DNP, AGNP-BC Adult Gerontology Nurse Practitioner [facility], [facility]

## 2018-06-05 ENCOUNTER — Encounter: Payer: Self-pay | Admitting: Medical Oncology

## 2018-06-05 ENCOUNTER — Emergency Department
Admission: EM | Admit: 2018-06-05 | Discharge: 2018-06-05 | Disposition: A | Discharge status: Home / Self Care | Payer: BLUE CROSS/BLUE SHIELD | Attending: Emergency Medicine | Admitting: Emergency Medicine

## 2018-06-05 DIAGNOSIS — S61412A Laceration without foreign body of left hand, initial encounter: Secondary | ICD-10-CM

## 2018-06-05 DIAGNOSIS — Y999 Unspecified external cause status: Secondary | ICD-10-CM | POA: Insufficient documentation

## 2018-06-05 DIAGNOSIS — W260XXA Contact with knife, initial encounter: Secondary | ICD-10-CM | POA: Diagnosis not present

## 2018-06-05 DIAGNOSIS — Z79899 Other long term (current) drug therapy: Secondary | ICD-10-CM | POA: Diagnosis not present

## 2018-06-05 DIAGNOSIS — Y939 Activity, unspecified: Secondary | ICD-10-CM | POA: Diagnosis not present

## 2018-06-05 DIAGNOSIS — Y92009 Unspecified place in unspecified non-institutional (private) residence as the place of occurrence of the external cause: Secondary | ICD-10-CM | POA: Insufficient documentation

## 2018-06-05 DIAGNOSIS — J45909 Unspecified asthma, uncomplicated: Secondary | ICD-10-CM | POA: Insufficient documentation

## 2018-06-05 MED ORDER — LIDOCAINE HCL (PF) 1 % IJ SOLN
5.0000 mL | Freq: Once | INTRAMUSCULAR | Status: DC
  Filled 2018-06-05: qty 5

## 2018-06-05 MED ORDER — PENTAFLUOROPROP-TETRAFLUOROETH EX AERO: 1.0000 "application " | INHALATION_SPRAY | CUTANEOUS | Status: DC | PRN

## 2018-06-05 NOTE — ED Triage Notes (Signed)
Pt cut palm of left hand with a knife, unintentional.

## 2018-06-05 NOTE — Discharge Instructions (Signed)
Keep the wound clean, dry, and covered. Follow-up with Dr. Kirtland Bouchard in 10-12 days for suture removal.

## 2018-06-05 NOTE — ED Provider Notes (Signed)
[facility] Emergency Department Provider Note ____________________________________________  Time seen: 1711  I have reviewed the triage vital signs and the nursing notes.  HISTORY  Chief Complaint  Laceration  HPI Chad Rose. is a 57 y.o. male resents to the ED accompanied by his wife, for evaluation and management of an accidental laceration to the left palm.  Patient describes using a large kitchen knife just prior to arrival.  He accidentally sliced across his thenar prominence.  He presents now with no active bleeding, and notes the current tetanus status.  He has a linear laceration across the palm, denies any other injury at this time.  Past Medical History:  Diagnosis Date  . Asthma   . Pre-diabetes     Patient Active Problem List   Diagnosis Date Noted  . Special screening for malignant neoplasms, colon   . Rectal polyp   . Sleep hypopnea 03/25/2018  . Subclinical hypothyroidism 12/23/2017  . Vitamin D deficiency 12/23/2017  . Pre-diabetes 11/26/2017  . Benign prostatic hyperplasia with nocturia 11/25/2017  . Tiredness 11/25/2017  . Primary insomnia 11/25/2017    Past Surgical History:  Procedure Laterality Date  . BACK SURGERY    . COLONOSCOPY WITH PROPOFOL N/A 04/21/2018   Procedure: COLONOSCOPY WITH PROPOFOL;  Surgeon: Lucilla Lame, MD;  Location: [place];  Service: Endoscopy;  Laterality: N/A;  . POLYPECTOMY  04/21/2018   Procedure: POLYPECTOMY;  Surgeon: Lucilla Lame, MD;  Location: [place];  Service: Endoscopy;;    Prior to Admission medications   Medication Sig Start Date End Date Taking? Authorizing Provider  Blood Glucose Monitoring Suppl (CONTOUR NEXT EZ) w/Device KIT 1 kit by Does not apply route 2 (two) times daily. 12/15/17   Karamalegos, Devonne Doughty, DO  Cholecalciferol (VITAMIN D PO) Take by mouth daily.    [provider]  gabapentin (NEURONTIN) 100 MG capsule Take gabapentin 100-300 mg  (1-3) capsules at bedtime as needed for nerve pain. 05/31/18   Mikey College, NP  glucose blood test strip Use as instructed 12/15/17   Olin Hauser, DO  ipratropium (ATROVENT) 0.06 % nasal spray Place 2 sprays into both nostrils 4 (four) times daily. For up to 5-7 days then stop. Patient not taking: Reported on 05/31/2018 02/16/18   Olin Hauser, DO  Multiple Vitamin (MULTIVITAMIN) capsule Take 1 capsule by mouth daily.    [provider]  naproxen sodium (ALEVE) 220 MG tablet Take 220 mg by mouth.    [provider]  predniSONE (DELTASONE) 20 MG tablet Day 1-4 take 3 pills once daily.  Day 5-6 take 2 pills.  Day 7-8 take 1 pill.  Day 9-10 take 1/2 pill. 05/31/18   Mikey College, NP  Specialty Vitamins Products (PROSTATE PO) Take by mouth.    [provider]  tamsulosin (FLOMAX) 0.4 MG CAPS capsule Take 1 capsule (0.4 mg total) by mouth daily. 11/25/17   Olin Hauser, DO    Allergies Patient has no known allergies.  Family History  Problem Relation Age of Onset  . Throat cancer Mother   . Alcohol abuse Father   . Cirrhosis Father   . Pneumonia Father   . Diabetes Neg Hx   . Prostate cancer Neg Hx   . Colon cancer Neg Hx     Social History Social History   Tobacco Use  . Smoking status: Never Smoker  . Smokeless tobacco: Never Used  Substance Use Topics  . Alcohol use:  Yes    Alcohol/week: 5.0 standard drinks    Types: 5 Cans of beer per week  . Drug use: No    Review of Systems  Constitutional: Negative for fever. Cardiovascular: Negative for chest pain. Respiratory: Negative for shortness of breath. Musculoskeletal: Negative for back pain. Skin: Negative for rash.  Left palm laceration as above. Neurological: Negative for headaches, focal weakness or numbness. ____________________________________________  PHYSICAL EXAM:  VITAL SIGNS: ED Triage Vitals  Enc Vitals Group     BP 06/05/18 1620  140/79     Pulse Rate 06/05/18 1620 94     Resp 06/05/18 1620 18     Temp 06/05/18 1620 98.2 F (36.8 C)     Temp Source 06/05/18 1620 Oral     SpO2 06/05/18 1620 98 %     Weight 06/05/18 1618 185 lb 3 oz (84 kg)     Height 06/05/18 1620 5' 9"  (1.753 m)     Head Circumference --      Peak Flow --      Pain Score 06/05/18 1618 5     Pain Loc --      Pain Edu? --      Excl. in [place]? --     Constitutional: Alert and oriented. Well appearing and in no distress. Head: Normocephalic and atraumatic. Eyes: Conjunctivae are normal. Normal extraocular movements Cardiovascular: Normal rate, regular rhythm. Normal distal pulses. Respiratory: Normal respiratory effort.  Musculoskeletal: Nontender with normal range of motion in all extremities.  Neurologic:  Normal gait without ataxia. Normal speech and language. No gross focal neurologic deficits are appreciated. Skin:  Skin is warm, dry and intact. No rash noted. ____________________________________________  PROCEDURES  .Marland KitchenLaceration Repair Date/Time: 06/05/2018 5:23 PM Performed by: Melvenia Needles, PA-C Authorized by: Melvenia Needles, PA-C   Consent:    Consent obtained:  Verbal   Consent given by:  Patient   Risks discussed:  Pain and poor wound healing Anesthesia (see MAR for exact dosages):    Anesthesia method:  Local infiltration   Local anesthetic:  Lidocaine 1% w/o epi Laceration details:    Location:  Hand   Hand location:  L palm   Length (cm):  3 Repair type:    Repair type:  Simple Pre-procedure details:    Preparation:  Patient was prepped and draped in usual sterile fashion Exploration:    Contaminated: no   Treatment:    Area cleansed with:  Betadine   Amount of cleaning:  Standard   Irrigation solution:  Sterile saline   Irrigation method:  Syringe Skin repair:    Repair method:  Sutures   Suture size:  4-0   Suture material:  Nylon   Suture technique:  Simple interrupted   Number of  sutures:  7 Approximation:    Approximation:  Close Post-procedure details:    Dressing:  Non-adherent dressing   Patient tolerance of procedure:  Tolerated well, no immediate complications  ____________________________________________  INITIAL IMPRESSION / ASSESSMENT AND PLAN / ED COURSE  Patient with ED evaluation and management of an accidental laceration to the left palm.  Patient's wound is closed using sutures with good wound edge approximation.  He is discharged with wound care instructions, and will follow with his primary care provider for suture removal in 10 to 12 days. ____________________________________________  FINAL CLINICAL IMPRESSION(S) / ED DIAGNOSES  Final diagnoses:  Laceration of left hand without foreign body, initial encounter      Timberlyn Pickford V  Berniece Salines, PA-C 06/05/18 1810    Schuyler Amor, MD 06/05/18 2003

## 2018-06-05 NOTE — ED Notes (Signed)
Pt to ED with lac on left hand. Knife slipped while cleaning it.

## 2018-06-14 ENCOUNTER — Other Ambulatory Visit: Payer: Self-pay

## 2018-06-14 ENCOUNTER — Ambulatory Visit
Admission: EM | Admit: 2018-06-14 | Discharge: 2018-06-14 | Disposition: A | Discharge status: Home / Self Care | Payer: BLUE CROSS/BLUE SHIELD | Attending: Family Medicine | Admitting: Family Medicine

## 2018-06-14 ENCOUNTER — Ambulatory Visit (INDEPENDENT_AMBULATORY_CARE_PROVIDER_SITE_OTHER): Payer: BLUE CROSS/BLUE SHIELD

## 2018-06-14 ENCOUNTER — Encounter: Payer: Self-pay | Admitting: Emergency Medicine

## 2018-06-14 DIAGNOSIS — M5412 Radiculopathy, cervical region: Secondary | ICD-10-CM

## 2018-06-14 DIAGNOSIS — M47812 Spondylosis without myelopathy or radiculopathy, cervical region: Secondary | ICD-10-CM | POA: Diagnosis not present

## 2018-06-14 MED ORDER — KETOROLAC TROMETHAMINE 60 MG/2ML IM SOLN
60.0000 mg | Freq: Once | INTRAMUSCULAR | Status: AC
  Administered 2018-06-14: 60 mg via INTRAMUSCULAR

## 2018-06-14 MED ORDER — METAXALONE 800 MG PO TABS: 800.0000 mg | ORAL_TABLET | Freq: Three times a day (TID) | ORAL | 0 refills | Status: DC

## 2018-06-14 MED ORDER — HYDROCODONE-ACETAMINOPHEN 5-325 MG PO TABS: 1.0000 | ORAL_TABLET | Freq: Four times a day (QID) | ORAL | 0 refills | Status: DC | PRN

## 2018-06-14 NOTE — ED Triage Notes (Signed)
Pt c/o neck and left shoulder pain. He reports that his shoulder has been bothering him for about a month but got worse about 2 am this morning. He has has known back problems from a prior injury, but no knew injuries.

## 2018-06-14 NOTE — Discharge Instructions (Signed)
Rest as much as possible and avoid symptoms.  Make an arrangement to follow-up with the spinal surgeon.Apply ice 20 minutes out of every 2 hours 4-5 times daily for comfort. Use  Caution while taking muscle relaxers.  Do not perform activities requiring concentration or judgment and do not drive.  For less severe pain use ibuprofen 400 mg combined with Tylenol 500 mg every 6 hours as necessary.  Do not take a total of more than 4 g of Tylenol on a daily basis.

## 2018-06-14 NOTE — ED Provider Notes (Signed)
[facility]    CSN: [id number] Arrival date & time: 06/14/18  0827     History   Chief Complaint Chief Complaint  Patient presents with  . Shoulder Pain  . Neck Pain    HPI Chad Rose. is a 57 y.o. male.   HPI  57 year old male accompanied by his wife presents with severe pain radiating into his left trapezius occasion will radiate into his arm causing numbness.  He has had lumbar spine injuries/surgeries in the past.  No known injury to his neck recently.  Review of his records he was seen earlier this month with the same complaints.  It was stated in that note that he has been experiencing the symptoms for 2 to 3 months.  He was treated with prednisone and gabapentin states that it has not helped in the least.  Now he is just miserable and is unable to sit without pain and prefers to lie down on his left side.  Is had no incontinence.  Did take the prednisone full course without any improvement as well as the gabapentin.  Is noted that he was offered a referral to neurosurgery or physical therapy but he declined both.  He has been taking Aleve without much result.            Past Medical History:  Diagnosis Date  . Asthma   . Pre-diabetes     Patient Active Problem List   Diagnosis Date Noted  . Special screening for malignant neoplasms, colon   . Rectal polyp   . Sleep hypopnea 03/25/2018  . Subclinical hypothyroidism 12/23/2017  . Vitamin D deficiency 12/23/2017  . Pre-diabetes 11/26/2017  . Benign prostatic hyperplasia with nocturia 11/25/2017  . Tiredness 11/25/2017  . Primary insomnia 11/25/2017    Past Surgical History:  Procedure Laterality Date  . BACK SURGERY    . COLONOSCOPY WITH PROPOFOL N/A 04/21/2018   Procedure: COLONOSCOPY WITH PROPOFOL;  Surgeon: Lucilla Lame, MD;  Location: [place];  Service: Endoscopy;  Laterality: N/A;  . POLYPECTOMY  04/21/2018   Procedure: POLYPECTOMY;  Surgeon: Lucilla Lame, MD;  Location:  [place];  Service: Endoscopy;;       Home Medications    Prior to Admission medications   Medication Sig Start Date End Date Taking? Authorizing Provider  Blood Glucose Monitoring Suppl (CONTOUR NEXT EZ) w/Device KIT 1 kit by Does not apply route 2 (two) times daily. 12/15/17  Yes Karamalegos, Devonne Doughty, DO  gabapentin (NEURONTIN) 100 MG capsule Take gabapentin 100-300 mg (1-3) capsules at bedtime as needed for nerve pain. 05/31/18  Yes Mikey College, NP  glucose blood test strip Use as instructed 12/15/17  Yes Karamalegos, Devonne Doughty, DO  Cholecalciferol (VITAMIN D PO) Take by mouth daily.    [provider]  HYDROcodone-acetaminophen (NORCO/VICODIN) 5-325 MG tablet Take 1-2 tablets by mouth every 6 (six) hours as needed for severe pain. 06/14/18   Lorin Picket, PA-C  ipratropium (ATROVENT) 0.06 % nasal spray Place 2 sprays into both nostrils 4 (four) times daily. For up to 5-7 days then stop. Patient not taking: Reported on 05/31/2018 02/16/18   Olin Hauser, DO  metaxalone (SKELAXIN) 800 MG tablet Take 1 tablet (800 mg total) by mouth 3 (three) times daily. 06/14/18   Lorin Picket, PA-C  naproxen sodium (ALEVE) 220 MG tablet Take 220 mg by mouth.    [provider]  tamsulosin (FLOMAX) 0.4 MG CAPS capsule Take 1 capsule (0.4  mg total) by mouth daily. 11/25/17   Olin Hauser, DO    Family History Family History  Problem Relation Age of Onset  . Throat cancer Mother   . Alcohol abuse Father   . Cirrhosis Father   . Pneumonia Father   . Diabetes Neg Hx   . Prostate cancer Neg Hx   . Colon cancer Neg Hx     Social History Social History   Tobacco Use  . Smoking status: Never Smoker  . Smokeless tobacco: Never Used  Substance Use Topics  . Alcohol use: Yes    Alcohol/week: 5.0 standard drinks    Types: 5 Cans of beer per week  . Drug use: No     Allergies   Patient has no known allergies.   Review of  Systems Review of Systems  Constitutional: Positive for activity change. Negative for appetite change, chills, diaphoresis, fatigue and fever.  Musculoskeletal: Positive for neck pain and neck stiffness.  All other systems reviewed and are negative.    Physical Exam Triage Vital Signs ED Triage Vitals  Enc Vitals Group     BP 06/14/18 0910 (!) 143/110     Pulse Rate 06/14/18 0910 86     Resp 06/14/18 0910 15     Temp 06/14/18 0910 98 F (36.7 C)     Temp Source 06/14/18 0910 Oral     SpO2 06/14/18 0910 98 %     Weight 06/14/18 0910 186 lb (84.4 kg)     Height 06/14/18 0910 _0  (1.753 m)     Head Circumference --      Peak Flow --      Pain Score 06/14/18 0909 10     Pain Loc --      Pain Edu? --      Excl. in [place]? --    No data found.  Updated Vital Signs BP (!) 143/110 (BP Location: Left Arm)   Pulse 86   Temp 98 F (36.7 C) (Oral)   Resp 15   Ht _1  (1.753 m)   Wt 186 lb (84.4 kg)   SpO2 98%   BMI 27.47 kg/m   Visual Acuity Right Eye Distance:   Left Eye Distance:   Bilateral Distance:    Right Eye Near:   Left Eye Near:    Bilateral Near:     Physical Exam  Constitutional: He is oriented to person, place, and time. He appears well-developed and well-nourished. No distress.  HENT:  Head: Normocephalic.  Eyes: Pupils are equal, round, and reactive to light. Right eye exhibits no discharge. Left eye exhibits no discharge.  Neck:  Exam of the cervical spine shows a very minimal range of motion with discomfort at all extremes.  Tenderness is along the left paracervical muscles leading into the trapezial muscle.  Upper extremity sensation is intact to light touch throughout.  Upper extremity strength shows all muscle groups 5/5.  DTRs are hyporeflexive but bilaterally equal.  Musculoskeletal: Normal range of motion.  Neurological: He is alert and oriented to person, place, and time.  Skin: Skin is warm and dry. He is not diaphoretic.  Psychiatric: He has a  normal mood and affect. His behavior is normal. Judgment and thought content normal.  Nursing note and vitals reviewed.    UC Treatments / Results  Labs (all labs ordered are listed, but only abnormal results are displayed) Labs Reviewed - No data to display  EKG None  Radiology Dg Cervical Spine Complete  Result Date: 06/14/2018 CLINICAL DATA:  Left radicular pain at C5 EXAM: CERVICAL SPINE - COMPLETE 4+ VIEW COMPARISON:  None. FINDINGS: Hypoplastic or postoperative spinous processes of C3, C4, and C5, of indeterminate significance. Degenerative disc narrowing and uncovertebral ridging primarily at C5-6 and C6-7, with right more than left foraminal impingement. Similar but milder changes at C4-5 where stenosis appears greater on the left. Facet spurring particularly bulky at C2-3 on the right. No evidence of fracture, bone lesion, or prevertebral soft tissue thickening. IMPRESSION: 1. Degenerative bony foraminal impingement at C5-6, C6-7, and to a lesser extent at C4-5. 2. Prominent C2-3 facet spurring on the right. 3. Hypoplastic or postoperative spinous processes at C3-C5. Electronically Signed   By: Monte Fantasia M.D.   On: 06/14/2018 10:11    Procedures Procedures (including critical care time)  Medications Ordered in UC Medications  ketorolac (TORADOL) injection 60 mg (60 mg Intramuscular Given 06/14/18 0949)    Initial Impression / Assessment and Plan / UC Course  I have reviewed the triage vital signs and the nursing notes.  Pertinent labs & imaging results that were available during my care of the patient were reviewed by me and considered in my medical decision making (see chart for details).     I reviewed the x-rays and the physical findings with the patient.  I have shown the patient the foraminal narrowing with spurring at C5-6 and C6-7 less so at C4-5.  States that this is very similar to what his lumbar spine problem was prior to his surgeries.  I have told him that  he should be evaluated by spinal surgeon to be followed and to perform a more thorough examination.  In the meantime I will provide him with Skelaxin muscle relaxer.  I told him to use ice 20 minutes every 2 hours 4-5 times daily.  I will provide him with a very few Vicodin for the extreme pain he is currently having.  I have told him it is preferable for him to use ibuprofen 400 mg combined with Tylenol 500 mg every 6 hours as necessary for severe pain.  He should limit his activities to help control his pain. Final Clinical Impressions(s) / UC Diagnoses   Final diagnoses:  Cervical radiculopathy     Discharge Instructions     Rest as much as possible and avoid symptoms.  Make an arrangement to follow-up with the spinal surgeon.Apply ice 20 minutes out of every 2 hours 4-5 times daily for comfort. Use  Caution while taking muscle relaxers.  Do not perform activities requiring concentration or judgment and do not drive.  For less severe pain use ibuprofen 400 mg combined with Tylenol 500 mg every 6 hours as necessary.  Do not take a total of more than 4 g of Tylenol on a daily basis.    ED Prescriptions    Medication Sig Dispense Auth. Provider   metaxalone (SKELAXIN) 800 MG tablet Take 1 tablet (800 mg total) by mouth 3 (three) times daily. 21 tablet Crecencio Mc P, PA-C   HYDROcodone-acetaminophen (NORCO/VICODIN) 5-325 MG tablet Take 1-2 tablets by mouth every 6 (six) hours as needed for severe pain. 10 tablet Lorin Picket, PA-C     Controlled Substance Prescriptions  Controlled Substance Registry consulted? [place] substance abuse registry was consulted and patient had no activity in the date range is used.   Lorin Picket, PA-C 06/14/18 1052

## 2018-06-16 DIAGNOSIS — M5412 Radiculopathy, cervical region: Secondary | ICD-10-CM | POA: Diagnosis not present

## 2018-06-16 DIAGNOSIS — Z01818 Encounter for other preprocedural examination: Secondary | ICD-10-CM | POA: Diagnosis not present

## 2018-06-16 DIAGNOSIS — M50321 Other cervical disc degeneration at C4-C5 level: Secondary | ICD-10-CM | POA: Diagnosis not present

## 2018-06-16 DIAGNOSIS — M5030 Other cervical disc degeneration, unspecified cervical region: Secondary | ICD-10-CM | POA: Diagnosis not present

## 2018-06-16 DIAGNOSIS — M4802 Spinal stenosis, cervical region: Secondary | ICD-10-CM | POA: Diagnosis not present

## 2018-06-16 DIAGNOSIS — M47812 Spondylosis without myelopathy or radiculopathy, cervical region: Secondary | ICD-10-CM | POA: Diagnosis not present

## 2018-06-17 DIAGNOSIS — M5412 Radiculopathy, cervical region: Secondary | ICD-10-CM | POA: Diagnosis not present

## 2018-06-17 DIAGNOSIS — Z01818 Encounter for other preprocedural examination: Secondary | ICD-10-CM | POA: Diagnosis not present

## 2018-06-17 DIAGNOSIS — I10 Essential (primary) hypertension: Secondary | ICD-10-CM | POA: Diagnosis not present

## 2018-06-21 ENCOUNTER — Ambulatory Visit: Payer: BLUE CROSS/BLUE SHIELD | Admitting: Family Medicine

## 2018-06-22 ENCOUNTER — Encounter: Payer: Self-pay | Admitting: Nurse Practitioner

## 2018-06-22 ENCOUNTER — Ambulatory Visit (INDEPENDENT_AMBULATORY_CARE_PROVIDER_SITE_OTHER): Payer: BLUE CROSS/BLUE SHIELD | Admitting: Nurse Practitioner

## 2018-06-22 ENCOUNTER — Other Ambulatory Visit: Payer: Self-pay

## 2018-06-22 VITALS — BP 128/77 | HR 73 | Ht 69.0 in | Wt 183.4 lb

## 2018-06-22 DIAGNOSIS — Z4802 Encounter for removal of sutures: Secondary | ICD-10-CM | POA: Diagnosis not present

## 2018-06-22 DIAGNOSIS — M5412 Radiculopathy, cervical region: Secondary | ICD-10-CM

## 2018-06-22 NOTE — Progress Notes (Signed)
Subjective:    Patient ID: Chad Rose., male    DOB: 06/30/1961, 57 y.o.   MRN: [id number]  Chad Rose. is a 57 y.o. male presenting on 06/22/2018 for Back Pain and Suture / Staple Removal (accidental laceration to the left palm x 17 days ago )   HPI Cervical Radiculopathy Patient verbalizes frustration that he was instructed to go to urgent care on 9/17 by our office when Dr. Kirtland Bouchard was out.  He was not aware there was available coverage, although in looking back at my schedule there may have been no available appointments. Had discussion with patient that I was covering that day and never received a message that he had concerns.  Instructed in future that he could request a message be sent to me if needing advice.  In general, would still like to seek care here and is content with this approach if encountering this same problem in future.   - At that urgent care visit, he was placed on prednisone and hydrocodone for pain.  Last dose prednisone this morning.  Last hydrocodone yesterday pm.  Patient was asked to have no oral medications prior to surgery other than Tylenol by Dr. Adriana Simas.  Surgery is planned for 06/23/2018.   - Patient has not tried OTC muscle rubs or lidocaine to date.  Left palm laceration Patient sought care on 06/05/2018 for accidental hand laceration caused by a knife.  Sutures were placed at [facility] ED.  He presents today for removal of these sutures.  He has had no complications, no additional bleeding and no significant pain associated with the laceration at this time.  Social History   Tobacco Use  . Smoking status: Never Smoker  . Smokeless tobacco: Never Used  Substance Use Topics  . Alcohol use: Yes    Alcohol/week: 5.0 standard drinks    Types: 5 Cans of beer per week  . Drug use: No    Review of Systems Per HPI unless specifically indicated above     Objective:    BP 128/77 (BP Location: Right Arm, Patient Position: Sitting, Cuff Size: Normal)    Pulse 73   Ht 5\' 9"  (1.753 m)   Wt 183 lb 6.4 oz (83.2 kg)   BMI 27.08 kg/m   Wt Readings from Last 3 Encounters:  06/22/18 183 lb 6.4 oz (83.2 kg)  06/14/18 186 lb (84.4 kg)  06/05/18 186 lb (84.4 kg)    Physical Exam  Constitutional: He is oriented to person, place, and time. He appears well-developed and well-nourished. No distress.  HENT:  Head: Normocephalic and atraumatic.  Neurological: He is alert and oriented to person, place, and time.  Skin: Skin is warm and dry. Capillary refill takes less than 2 seconds.     Psychiatric: He has a normal mood and affect. His behavior is normal. Judgment and thought content normal.  Vitals reviewed.    Results for orders placed or performed during the hospital encounter of 04/21/18  Glucose, capillary  Result Value Ref Range   Glucose-Capillary 100 (H) 70 - 99 mg/dL      Assessment & Plan:   Problem List Items Addressed This Visit    None    Visit Diagnoses    Visit for suture removal    -  Primary Well-healed laceration with sutures in place.  All 7 sutures removed without complication.  Small flap of skin is non-intact and at risk for tearing injury/re-opening of wound.  Advised patient to  keep covered for next 5-7 days or until this skin has been allowed to come off.  Placed 2 steri strips across laceration.  Once the steri strips fall off, may leave wound uncovered.  Cleanse normally with regular handwashing and showering.   Followup prn in next 7-10 days.    Cervical radiculopathy     Ongoing, worsening off pain medications.  Plan for ACD[facility] by Dr. Adriana Simas at [facility].  Patient may find some relief with topical lidocaine or lidocaine patch today.  Encouraged patient to use acetaminophen as previously instructed.  Followup prn after surgery.      Follow up plan: Return if symptoms worsen or fail to improve.  Wilhelmina Mcardle, DNP, AGPCNP-BC Adult Gerontology Primary Care Nurse Practitioner [facility] 06/22/2018, 8:14 AM

## 2018-06-22 NOTE — Patient Instructions (Addendum)
Chad Rose.,   Thank you for coming in to clinic today.  1. Can consider lidocaine patch today in advance of your surgery tomorrow.  These are Over the Counter.   - Continue acetaminophen (Tylenol) as needed today as well.  2. Keep your hand wound covered until completely healed to prevent the wound from reopening.  Please schedule a follow-up appointment with Chad Rose, AGNP. Return if symptoms worsen or fail to improve.  If you have any other questions or concerns, please feel free to call the clinic or send a message through MyChart. You may also schedule an earlier appointment if necessary.  You will receive a survey after today's visit either digitally by e-mail or paper by [employer]. Your experiences and feedback matter to [place].  Please respond so we know how we are doing as we provide care for you.   Chad Mcardle, DNP, AGNP-BC Adult Gerontology Nurse Practitioner [facility], [facility]

## 2018-06-23 DIAGNOSIS — Z79899 Other long term (current) drug therapy: Secondary | ICD-10-CM | POA: Diagnosis not present

## 2018-06-23 DIAGNOSIS — J45909 Unspecified asthma, uncomplicated: Secondary | ICD-10-CM | POA: Diagnosis not present

## 2018-06-23 DIAGNOSIS — M5412 Radiculopathy, cervical region: Secondary | ICD-10-CM | POA: Diagnosis not present

## 2018-06-23 DIAGNOSIS — E119 Type 2 diabetes mellitus without complications: Secondary | ICD-10-CM | POA: Diagnosis not present

## 2018-06-23 DIAGNOSIS — I10 Essential (primary) hypertension: Secondary | ICD-10-CM | POA: Diagnosis not present

## 2018-06-23 DIAGNOSIS — M4802 Spinal stenosis, cervical region: Secondary | ICD-10-CM | POA: Diagnosis not present

## 2018-06-23 DIAGNOSIS — M50122 Cervical disc disorder at C5-C6 level with radiculopathy: Secondary | ICD-10-CM | POA: Diagnosis not present

## 2018-06-23 DIAGNOSIS — M4722 Other spondylosis with radiculopathy, cervical region: Secondary | ICD-10-CM | POA: Diagnosis not present

## 2018-06-24 DIAGNOSIS — M4802 Spinal stenosis, cervical region: Secondary | ICD-10-CM | POA: Diagnosis not present

## 2018-06-24 DIAGNOSIS — M5412 Radiculopathy, cervical region: Secondary | ICD-10-CM | POA: Diagnosis not present

## 2018-06-24 DIAGNOSIS — I10 Essential (primary) hypertension: Secondary | ICD-10-CM | POA: Diagnosis not present

## 2018-06-24 DIAGNOSIS — J45909 Unspecified asthma, uncomplicated: Secondary | ICD-10-CM | POA: Diagnosis not present

## 2018-06-24 DIAGNOSIS — M50122 Cervical disc disorder at C5-C6 level with radiculopathy: Secondary | ICD-10-CM | POA: Diagnosis not present

## 2018-06-24 DIAGNOSIS — M4722 Other spondylosis with radiculopathy, cervical region: Secondary | ICD-10-CM | POA: Diagnosis not present

## 2018-06-24 DIAGNOSIS — E119 Type 2 diabetes mellitus without complications: Secondary | ICD-10-CM | POA: Diagnosis not present

## 2018-06-24 DIAGNOSIS — Z79899 Other long term (current) drug therapy: Secondary | ICD-10-CM | POA: Diagnosis not present

## 2018-07-06 DIAGNOSIS — Z981 Arthrodesis status: Secondary | ICD-10-CM | POA: Diagnosis not present

## 2018-07-19 ENCOUNTER — Other Ambulatory Visit: Payer: Self-pay | Admitting: Family Medicine

## 2018-07-19 ENCOUNTER — Ambulatory Visit: Payer: BLUE CROSS/BLUE SHIELD | Admitting: Family Medicine

## 2018-07-19 ENCOUNTER — Encounter: Payer: Self-pay | Admitting: Family Medicine

## 2018-07-19 VITALS — BP 109/71 | HR 49 | Temp 98.1°F | Resp 16 | Ht 69.0 in | Wt 180.0 lb

## 2018-07-19 DIAGNOSIS — Z0000 Encounter for general adult medical examination without abnormal findings: Secondary | ICD-10-CM

## 2018-07-19 DIAGNOSIS — R7303 Prediabetes: Secondary | ICD-10-CM | POA: Diagnosis not present

## 2018-07-19 DIAGNOSIS — R351 Nocturia: Secondary | ICD-10-CM

## 2018-07-19 DIAGNOSIS — M5412 Radiculopathy, cervical region: Secondary | ICD-10-CM | POA: Insufficient documentation

## 2018-07-19 DIAGNOSIS — Z1159 Encounter for screening for other viral diseases: Secondary | ICD-10-CM

## 2018-07-19 DIAGNOSIS — Z981 Arthrodesis status: Secondary | ICD-10-CM

## 2018-07-19 DIAGNOSIS — G4730 Sleep apnea, unspecified: Secondary | ICD-10-CM | POA: Diagnosis not present

## 2018-07-19 DIAGNOSIS — E1169 Type 2 diabetes mellitus with other specified complication: Secondary | ICD-10-CM | POA: Insufficient documentation

## 2018-07-19 DIAGNOSIS — E782 Mixed hyperlipidemia: Secondary | ICD-10-CM

## 2018-07-19 DIAGNOSIS — E559 Vitamin D deficiency, unspecified: Secondary | ICD-10-CM

## 2018-07-19 DIAGNOSIS — E785 Hyperlipidemia, unspecified: Secondary | ICD-10-CM

## 2018-07-19 DIAGNOSIS — F5101 Primary insomnia: Secondary | ICD-10-CM

## 2018-07-19 DIAGNOSIS — N401 Enlarged prostate with lower urinary tract symptoms: Secondary | ICD-10-CM

## 2018-07-19 NOTE — Patient Instructions (Addendum)
Thank you for coming to the office today.  A1c 7.7, elevated - we will check once more within 4 months to determine if still >6.5, then would be considered a Type 2 Diabetic. Now given surgery, medicine, diet limitation, this may be cause of raised sugar.  Try to improve diet as best you can, low carb, more water.  No need for CPAP machine at this time.  Try to avoid sleeping directly on your back. Or you can elevate head of bed by 30 degrees to help sleep.   DUE for FASTING BLOOD WORK (no food or drink after midnight before the lab appointment, only water or coffee without cream/sugar on the morning of)  SCHEDULE "Lab Only" visit in the morning at the clinic for lab draw in 4 MONTHS   - Make sure Lab Only appointment is at about 1 week before your next appointment, so that results will be available  For Lab Results, once available within 2-3 days of blood draw, you can can log in to MyChart online to view your results and a brief explanation. Also, we can discuss results at next follow-up visit.  Please schedule a Follow-up Appointment to: Return in about 4 months (around 11/19/2018) for Annual Physical.  If you have any other questions or concerns, please feel free to call the office or send a message through MyChart. You may also schedule an earlier appointment if necessary.  Additionally, you may be receiving a survey about your experience at our office within a few days to 1 week by e-mail or mail. We value your feedback.  Saralyn Pilar, DO [facility], [place]

## 2018-07-19 NOTE — Progress Notes (Signed)
Subjective:    Patient ID: Chad Rose., male    DOB: [birth year], 57 y.o.   MRN: [id number]  Onix Jumper. is a 57 y.o. male presenting on 07/19/2018 for Pre-Diabetes   HPI   S/p C-spine C5-6 / C6-7 spinal fusion / C-spine DJD/OA with radiculopathy Followed by [facility], recent course with visit to our office in 05/2018, seen by Wilhelmina Mcardle, AGPCNP-BC, treated for neck pain and radiculopathy with gabapentin and prednisone burst, temporary benefit then worsening, then he went to ED / [facility], treated temporarily and ultimately he was advised to return to Neurosurgery, he scheduled w/ Dr Adriana Simas, and later had surgery on 06/23/18. - Recent course post-op he has had improved symptoms, he still has problem with diet, cannot eat solids due to healing, seems to be improving now over past few days, was told up to 1 month with difficulty swallowing. He has started driving today, and will return in 1-2 week for post op then may return to light duty before he can resume lifting.  Elevated A1c / Pre-Diabetes Recent trend with A1c up to 7.0 on prior check at risk for DM2, then lifestyle overhaul and he has dramatically improved his diet and lifestyle, some weight loss, and repeat check A1c down to 6.0. Now consistent with Pre-Diabetes - Today reports feeling better, doing well on improved lifestyle - Has more energy, see below - Denies polyuria, hypoglycemia, numbness tingling  FOLLOW-UPSleep Study / Sleep Hypopnea Recent results with 04/2018 sleep study at [facility], showed no OSA diagnosis, but hypopnea in supine position. No CPAP indicated. - Other updates, prior labs were with mild elevated TSH and mild low Vitamin D - He has improved some energy overall in past  Health Maintenance: Due for Flu Shot, declines today despite counseling on benefits   Depression screen [facility] 2/9 07/19/2018 02/16/2018 12/23/2017  Decreased Interest 0 0 0  Down, Depressed,  Hopeless 0 0 0  PHQ - 2 Score 0 0 0    Social History   Tobacco Use  . Smoking status: Never Smoker  . Smokeless tobacco: Never Used  Substance Use Topics  . Alcohol use: Yes    Alcohol/week: 5.0 standard drinks    Types: 5 Cans of beer per week  . Drug use: No    Review of Systems Per HPI unless specifically indicated above     Objective:    BP 109/71   Pulse (!) 49   Temp 98.1 F (36.7 C) (Oral)   Resp 16   Ht 5\' 9"  (1.753 m)   Wt 180 lb (81.6 kg)   BMI 26.58 kg/m   Wt Readings from Last 3 Encounters:  07/19/18 180 lb (81.6 kg)  06/22/18 183 lb 6.4 oz (83.2 kg)  06/14/18 186 lb (84.4 kg)    Physical Exam  Constitutional: He is oriented to person, place, and time. He appears well-developed and well-nourished. No distress.  Well-appearing, comfortable, cooperative  HENT:  Head: Normocephalic and atraumatic.  Mouth/Throat: Oropharynx is clear and moist.  Eyes: Conjunctivae are normal. Right eye exhibits no discharge. Left eye exhibits no discharge.  Cardiovascular: Normal rate.  Pulmonary/Chest: Effort normal.  Musculoskeletal: He exhibits no edema.  Neurological: He is alert and oriented to person, place, and time.  Skin: Skin is warm and dry. No rash noted. He is not diaphoretic. No erythema.  Post surgical scar anterior neck  Psychiatric: He has a normal mood and affect. His behavior is normal.  Well groomed, good eye contact, normal speech and thoughts  Nursing note and vitals reviewed.  Results for orders placed or performed in visit on 07/19/18  POCT HgB A1C  Result Value Ref Range   Hemoglobin A1C 7.7 (A) 4.0 - 5.6 %      Assessment & Plan:   Problem List Items Addressed This Visit    Cervical radiculopathy    Improved now s/p spinal fusion surgery Followed by [facility], < 1 month post op      Relevant Medications   methocarbamol (ROBAXIN) 500 MG tablet   Pre-diabetes - Primary    Now dramatically worsening control w/ elevated up to  7.7, technically in range of Type 2 Diabetes, given prior result 6.0, and due to secondary very poor diet due to cannot eat regular diet post-op and recent steroids for radiculopathy, will defer brand new dx of T2DM for now  Plan Diet / lifestyle recommendations primarily - now can improve diet post op Advised that would avoid new start Metformin given would not know accurate level of A1c if treated, and not diagnosed DM - Offered repeat serum draw, but he declines today - Follow-up 4 months for follow-up yearly w/ labs including A1c      Relevant Orders   POCT HgB A1C (Completed)   S/P cervical spinal fusion   Sleep hypopnea    Stable sleep hypopnea Reviewed results of last PSG, recommended avoid supine or elevate head of bed, retest in future if not improved         No orders of the defined types were placed in this encounter.   Follow up plan: Return in about 4 months (around 11/19/2018) for Annual Physical.  Future labs ordered for 11/14/18  Saralyn Pilar, DO [facility] 07/19/2018, 8:38 AM

## 2018-07-20 DIAGNOSIS — Z981 Arthrodesis status: Secondary | ICD-10-CM | POA: Insufficient documentation

## 2018-07-20 LAB — POCT GLYCOSYLATED HEMOGLOBIN (HGB A1C): Hemoglobin A1C: 7.7 % — AB (ref 4.0–5.6)

## 2018-07-20 NOTE — Assessment & Plan Note (Signed)
Now dramatically worsening control w/ elevated up to 7.7, technically in range of Type 2 Diabetes, given prior result 6.0, and due to secondary very poor diet due to cannot eat regular diet post-op and recent steroids for radiculopathy, will defer brand new dx of T2DM for now  Plan Diet / lifestyle recommendations primarily - now can improve diet post op Advised that would avoid new start Metformin given would not know accurate level of A1c if treated, and not diagnosed DM - Offered repeat serum draw, but he declines today - Follow-up 4 months for follow-up yearly w/ labs including A1c

## 2018-07-20 NOTE — Assessment & Plan Note (Signed)
Improved now s/p spinal fusion surgery Followed by [facility], < 1 month post op

## 2018-07-20 NOTE — Assessment & Plan Note (Signed)
Stable sleep hypopnea Reviewed results of last PSG, recommended avoid supine or elevate head of bed, retest in future if not improved

## 2018-08-03 DIAGNOSIS — Z981 Arthrodesis status: Secondary | ICD-10-CM | POA: Diagnosis not present

## 2018-08-10 DIAGNOSIS — M25512 Pain in left shoulder: Secondary | ICD-10-CM | POA: Diagnosis not present

## 2018-08-10 DIAGNOSIS — M6281 Muscle weakness (generalized): Secondary | ICD-10-CM | POA: Diagnosis not present

## 2018-08-10 DIAGNOSIS — M542 Cervicalgia: Secondary | ICD-10-CM | POA: Diagnosis not present

## 2018-08-10 DIAGNOSIS — M25511 Pain in right shoulder: Secondary | ICD-10-CM | POA: Diagnosis not present

## 2018-08-12 DIAGNOSIS — M25512 Pain in left shoulder: Secondary | ICD-10-CM | POA: Diagnosis not present

## 2018-08-12 DIAGNOSIS — M6281 Muscle weakness (generalized): Secondary | ICD-10-CM | POA: Diagnosis not present

## 2018-08-12 DIAGNOSIS — M542 Cervicalgia: Secondary | ICD-10-CM | POA: Diagnosis not present

## 2018-08-12 DIAGNOSIS — M25511 Pain in right shoulder: Secondary | ICD-10-CM | POA: Diagnosis not present

## 2018-08-19 DIAGNOSIS — M25512 Pain in left shoulder: Secondary | ICD-10-CM | POA: Diagnosis not present

## 2018-08-19 DIAGNOSIS — M542 Cervicalgia: Secondary | ICD-10-CM | POA: Diagnosis not present

## 2018-08-19 DIAGNOSIS — M6281 Muscle weakness (generalized): Secondary | ICD-10-CM | POA: Diagnosis not present

## 2018-08-19 DIAGNOSIS — M25511 Pain in right shoulder: Secondary | ICD-10-CM | POA: Diagnosis not present

## 2018-08-23 DIAGNOSIS — M25511 Pain in right shoulder: Secondary | ICD-10-CM | POA: Diagnosis not present

## 2018-08-23 DIAGNOSIS — M25512 Pain in left shoulder: Secondary | ICD-10-CM | POA: Diagnosis not present

## 2018-08-23 DIAGNOSIS — M6281 Muscle weakness (generalized): Secondary | ICD-10-CM | POA: Diagnosis not present

## 2018-08-23 DIAGNOSIS — M542 Cervicalgia: Secondary | ICD-10-CM | POA: Diagnosis not present

## 2018-08-31 DIAGNOSIS — M542 Cervicalgia: Secondary | ICD-10-CM | POA: Diagnosis not present

## 2018-08-31 DIAGNOSIS — M25512 Pain in left shoulder: Secondary | ICD-10-CM | POA: Diagnosis not present

## 2018-08-31 DIAGNOSIS — M6281 Muscle weakness (generalized): Secondary | ICD-10-CM | POA: Diagnosis not present

## 2018-08-31 DIAGNOSIS — M25511 Pain in right shoulder: Secondary | ICD-10-CM | POA: Diagnosis not present

## 2018-09-02 DIAGNOSIS — M25512 Pain in left shoulder: Secondary | ICD-10-CM | POA: Diagnosis not present

## 2018-09-02 DIAGNOSIS — M6281 Muscle weakness (generalized): Secondary | ICD-10-CM | POA: Diagnosis not present

## 2018-09-02 DIAGNOSIS — M25511 Pain in right shoulder: Secondary | ICD-10-CM | POA: Diagnosis not present

## 2018-09-02 DIAGNOSIS — M542 Cervicalgia: Secondary | ICD-10-CM | POA: Diagnosis not present

## 2018-09-06 DIAGNOSIS — M25511 Pain in right shoulder: Secondary | ICD-10-CM | POA: Diagnosis not present

## 2018-09-06 DIAGNOSIS — M6281 Muscle weakness (generalized): Secondary | ICD-10-CM | POA: Diagnosis not present

## 2018-09-06 DIAGNOSIS — M542 Cervicalgia: Secondary | ICD-10-CM | POA: Diagnosis not present

## 2018-09-06 DIAGNOSIS — M25512 Pain in left shoulder: Secondary | ICD-10-CM | POA: Diagnosis not present

## 2018-09-08 DIAGNOSIS — M25511 Pain in right shoulder: Secondary | ICD-10-CM | POA: Diagnosis not present

## 2018-09-08 DIAGNOSIS — M542 Cervicalgia: Secondary | ICD-10-CM | POA: Diagnosis not present

## 2018-09-08 DIAGNOSIS — M25512 Pain in left shoulder: Secondary | ICD-10-CM | POA: Diagnosis not present

## 2018-09-08 DIAGNOSIS — M6281 Muscle weakness (generalized): Secondary | ICD-10-CM | POA: Diagnosis not present

## 2018-09-13 DIAGNOSIS — M542 Cervicalgia: Secondary | ICD-10-CM | POA: Diagnosis not present

## 2018-09-13 DIAGNOSIS — M25512 Pain in left shoulder: Secondary | ICD-10-CM | POA: Diagnosis not present

## 2018-09-13 DIAGNOSIS — M25511 Pain in right shoulder: Secondary | ICD-10-CM | POA: Diagnosis not present

## 2018-09-13 DIAGNOSIS — M6281 Muscle weakness (generalized): Secondary | ICD-10-CM | POA: Diagnosis not present

## 2018-09-14 DIAGNOSIS — Z981 Arthrodesis status: Secondary | ICD-10-CM | POA: Diagnosis not present

## 2018-09-15 DIAGNOSIS — M6281 Muscle weakness (generalized): Secondary | ICD-10-CM | POA: Diagnosis not present

## 2018-09-15 DIAGNOSIS — M25512 Pain in left shoulder: Secondary | ICD-10-CM | POA: Diagnosis not present

## 2018-09-15 DIAGNOSIS — M542 Cervicalgia: Secondary | ICD-10-CM | POA: Diagnosis not present

## 2018-09-15 DIAGNOSIS — M25511 Pain in right shoulder: Secondary | ICD-10-CM | POA: Diagnosis not present

## 2018-09-19 DIAGNOSIS — M25512 Pain in left shoulder: Secondary | ICD-10-CM | POA: Diagnosis not present

## 2018-09-19 DIAGNOSIS — M542 Cervicalgia: Secondary | ICD-10-CM | POA: Diagnosis not present

## 2018-09-19 DIAGNOSIS — M6281 Muscle weakness (generalized): Secondary | ICD-10-CM | POA: Diagnosis not present

## 2018-09-19 DIAGNOSIS — M25511 Pain in right shoulder: Secondary | ICD-10-CM | POA: Diagnosis not present

## 2018-09-20 DIAGNOSIS — M25512 Pain in left shoulder: Secondary | ICD-10-CM | POA: Diagnosis not present

## 2018-09-20 DIAGNOSIS — M6281 Muscle weakness (generalized): Secondary | ICD-10-CM | POA: Diagnosis not present

## 2018-09-20 DIAGNOSIS — M25511 Pain in right shoulder: Secondary | ICD-10-CM | POA: Diagnosis not present

## 2018-09-20 DIAGNOSIS — M542 Cervicalgia: Secondary | ICD-10-CM | POA: Diagnosis not present

## 2018-09-30 DIAGNOSIS — M25512 Pain in left shoulder: Secondary | ICD-10-CM | POA: Diagnosis not present

## 2018-09-30 DIAGNOSIS — M25511 Pain in right shoulder: Secondary | ICD-10-CM | POA: Diagnosis not present

## 2018-09-30 DIAGNOSIS — M542 Cervicalgia: Secondary | ICD-10-CM | POA: Diagnosis not present

## 2018-09-30 DIAGNOSIS — M6281 Muscle weakness (generalized): Secondary | ICD-10-CM | POA: Diagnosis not present

## 2018-11-11 ENCOUNTER — Other Ambulatory Visit: Payer: Self-pay

## 2018-11-11 DIAGNOSIS — R7303 Prediabetes: Secondary | ICD-10-CM

## 2018-11-11 DIAGNOSIS — Z1159 Encounter for screening for other viral diseases: Secondary | ICD-10-CM

## 2018-11-11 DIAGNOSIS — R351 Nocturia: Secondary | ICD-10-CM

## 2018-11-11 DIAGNOSIS — N401 Enlarged prostate with lower urinary tract symptoms: Secondary | ICD-10-CM

## 2018-11-11 DIAGNOSIS — E559 Vitamin D deficiency, unspecified: Secondary | ICD-10-CM

## 2018-11-11 DIAGNOSIS — Z0000 Encounter for general adult medical examination without abnormal findings: Secondary | ICD-10-CM

## 2018-11-11 DIAGNOSIS — F5101 Primary insomnia: Secondary | ICD-10-CM

## 2018-11-11 DIAGNOSIS — E782 Mixed hyperlipidemia: Secondary | ICD-10-CM

## 2018-11-14 ENCOUNTER — Other Ambulatory Visit: Payer: BLUE CROSS/BLUE SHIELD

## 2018-11-21 ENCOUNTER — Encounter: Payer: BLUE CROSS/BLUE SHIELD | Admitting: Family Medicine

## 2018-12-05 ENCOUNTER — Other Ambulatory Visit: Payer: BLUE CROSS/BLUE SHIELD

## 2018-12-05 DIAGNOSIS — N401 Enlarged prostate with lower urinary tract symptoms: Secondary | ICD-10-CM | POA: Diagnosis not present

## 2018-12-05 DIAGNOSIS — R351 Nocturia: Secondary | ICD-10-CM | POA: Diagnosis not present

## 2018-12-05 DIAGNOSIS — E559 Vitamin D deficiency, unspecified: Secondary | ICD-10-CM | POA: Diagnosis not present

## 2018-12-05 DIAGNOSIS — E782 Mixed hyperlipidemia: Secondary | ICD-10-CM | POA: Diagnosis not present

## 2018-12-05 DIAGNOSIS — R7303 Prediabetes: Secondary | ICD-10-CM | POA: Diagnosis not present

## 2018-12-06 LAB — TSH: TSH: 8.64 mIU/L — ABNORMAL HIGH (ref 0.40–4.50)

## 2018-12-06 LAB — COMPLETE METABOLIC PANEL WITH GFR
AG Ratio: 1.4 (calc) (ref 1.0–2.5)
ALBUMIN MSPROF: 4.3 g/dL (ref 3.6–5.1)
ALT: 20 U/L (ref 9–46)
AST: 16 U/L (ref 10–35)
Alkaline phosphatase (APISO): 78 U/L (ref 35–144)
BUN / CREAT RATIO: 18 (calc) (ref 6–22)
BUN: 24 mg/dL (ref 7–25)
CO2: 28 mmol/L (ref 20–32)
Calcium: 9.7 mg/dL (ref 8.6–10.3)
Chloride: 100 mmol/L (ref 98–110)
Creat: 1.34 mg/dL — ABNORMAL HIGH (ref 0.70–1.33)
GFR, Est African American: 68 mL/min/{1.73_m2} (ref >=60)
GFR, Est Non African American: 58 mL/min/{1.73_m2} — ABNORMAL LOW (ref >=60)
Globulin: 3 g/dL (calc) (ref 1.9–3.7)
Glucose, Bld: 120 mg/dL — ABNORMAL HIGH (ref 65–99)
Potassium: 4.6 mmol/L (ref 3.5–5.3)
SODIUM: 139 mmol/L (ref 135–146)
Total Bilirubin: 0.4 mg/dL (ref 0.2–1.2)
Total Protein: 7.3 g/dL (ref 6.1–8.1)

## 2018-12-06 LAB — CBC WITH DIFFERENTIAL/PLATELET
Absolute Monocytes: 537 cells/uL (ref 200–950)
BASOS PCT: 0.5 %
Basophils Absolute: 30 cells/uL (ref 0–200)
EOS PCT: 1.2 %
Eosinophils Absolute: 71 cells/uL (ref 15–500)
HCT: 45.7 % (ref 38.5–50.0)
Hemoglobin: 15.6 g/dL (ref 13.2–17.1)
Lymphs Abs: 2395 cells/uL (ref 850–3900)
MCH: 30.5 pg (ref 27.0–33.0)
MCHC: 34.1 g/dL (ref 32.0–36.0)
MCV: 89.4 fL (ref 80.0–100.0)
MPV: 9.5 fL (ref 7.5–12.5)
Monocytes Relative: 9.1 %
Neutro Abs: 2867 cells/uL (ref 1500–7800)
Neutrophils Relative %: 48.6 %
Platelets: 215 10*3/uL (ref 140–400)
RBC: 5.11 10*6/uL (ref 4.20–5.80)
RDW: 12.3 % (ref 11.0–15.0)
Total Lymphocyte: 40.6 %
WBC: 5.9 10*3/uL (ref 3.8–10.8)

## 2018-12-06 LAB — HEPATITIS C ANTIBODY
Hepatitis C Ab: NONREACTIVE
SIGNAL TO CUT-OFF: 0.01 (ref <1.00)

## 2018-12-06 LAB — VITAMIN D 25 HYDROXY (VIT D DEFICIENCY, FRACTURES): Vit D, 25-Hydroxy: 28 ng/mL — ABNORMAL LOW (ref 30–100)

## 2018-12-06 LAB — HEMOGLOBIN A1C
Hgb A1c MFr Bld: 6.6 % of total Hgb — ABNORMAL HIGH (ref <5.7)
Mean Plasma Glucose: 143 (calc)
eAG (mmol/L): 7.9 (calc)

## 2018-12-06 LAB — LIPID PANEL
Cholesterol: 280 mg/dL — ABNORMAL HIGH (ref <200)
HDL: 40 mg/dL (ref >=40)
Non-HDL Cholesterol (Calc): 240 mg/dL (calc) — ABNORMAL HIGH (ref <130)
Total CHOL/HDL Ratio: 7 (calc) — ABNORMAL HIGH (ref <5.0)
Triglycerides: 457 mg/dL — ABNORMAL HIGH (ref <150)

## 2018-12-06 LAB — T4, FREE: Free T4: 1.6 ng/dL (ref 0.8–1.8)

## 2018-12-06 LAB — PSA: PSA: 0.5 ng/mL (ref <=4.0)

## 2018-12-12 ENCOUNTER — Encounter: Payer: Self-pay | Admitting: Family Medicine

## 2018-12-12 ENCOUNTER — Ambulatory Visit (INDEPENDENT_AMBULATORY_CARE_PROVIDER_SITE_OTHER): Payer: BLUE CROSS/BLUE SHIELD | Admitting: Family Medicine

## 2018-12-12 ENCOUNTER — Other Ambulatory Visit: Payer: Self-pay

## 2018-12-12 VITALS — BP 126/67 | HR 77 | Temp 98.0°F | Resp 16 | Ht 69.0 in | Wt 190.0 lb

## 2018-12-12 DIAGNOSIS — E1169 Type 2 diabetes mellitus with other specified complication: Secondary | ICD-10-CM

## 2018-12-12 DIAGNOSIS — R7989 Other specified abnormal findings of blood chemistry: Secondary | ICD-10-CM

## 2018-12-12 DIAGNOSIS — E785 Hyperlipidemia, unspecified: Secondary | ICD-10-CM | POA: Diagnosis not present

## 2018-12-12 DIAGNOSIS — Z0000 Encounter for general adult medical examination without abnormal findings: Secondary | ICD-10-CM | POA: Diagnosis not present

## 2018-12-12 MED ORDER — GLUCOSE BLOOD VI STRP: ORAL_STRIP | 5 refills | Status: DC

## 2018-12-12 MED ORDER — METFORMIN HCL ER 500 MG PO TB24: 500.0000 mg | ORAL_TABLET | Freq: Every day | ORAL | 3 refills | Status: DC

## 2018-12-12 NOTE — Progress Notes (Signed)
Subjective:    Patient ID: Chad Flash., male    DOB: Dec 28, 1960, 58 y.o.   MRN: [id number]  Chad Rose. is a 58 y.o. male presenting on 12/12/2018 for Annual Exam   HPI   Here for Annual Physical and lab review  Type 2 Diabetes, new diagnosis Last lab showed persistent elevated A1c trend 6.6, prior >7 previously, last visit he tried to improve lifestyle did improve A1c but still elevated. He admits more recently difficulty with poor diet, limiting carbs, and exercise - Prior PreDM - No prior medications for hyperglycemia  HYPERLIPIDEMIA: - Reports no concerns. Last lipid panel 11/2018, showed some elevated LDL, Triglycerides, >450 abnormal - Not on cholesterol medicine  Elevated TSH TSH 8.64 and normal FreeT4 on last lab. He has no history of thyroid problem  LEFT EAR, cerumen impaction Reports some fullness within ear, not painful. Admits history earwax.  Health Maintenance: UTD Colonoscopy 2019 UTD FLu vaccine UTD Hep C screening last lab, negative  Depression screen [facility] 2/9 12/12/2018 07/19/2018 02/16/2018  Decreased Interest 0 0 0  Down, Depressed, Hopeless 0 0 0  PHQ - 2 Score 0 0 0    Past Medical History:  Diagnosis Date  . Asthma   . Pre-diabetes    Past Surgical History:  Procedure Laterality Date  . BACK SURGERY    . COLONOSCOPY WITH PROPOFOL N/A 04/21/2018   Procedure: COLONOSCOPY WITH PROPOFOL;  Surgeon: Lucilla Lame, MD;  Location: [facility];  Service: Endoscopy;  Laterality: N/A;  . POLYPECTOMY  04/21/2018   Procedure: POLYPECTOMY;  Surgeon: Lucilla Lame, MD;  Location[facility];  Service: Endoscopy;;   Social History   Socioeconomic History  . Marital status: Married    Spouse name: Not on file  . Number of children: 3  . Years of education: [employer]  . Highest education level: High school graduate  Occupational History  . Occupation: Musician    Comment: [place] / [place]  Social Needs  .  Financial resource strain: Not on file  . Food insecurity:    Worry: Not on file    Inability: Not on file  . Transportation needs:    Medical: Not on file    Non-medical: Not on file  Tobacco Use  . Smoking status: Never Smoker  . Smokeless tobacco: Never Used  Substance and Sexual Activity  . Alcohol use: Yes    Alcohol/week: 5.0 standard drinks    Types: 5 Cans of beer per week  . Drug use: No  . Sexual activity: Not on file  Lifestyle  . Physical activity:    Days per week: Not on file    Minutes per session: Not on file  . Stress: Not on file  Relationships  . Social connections:    Talks on phone: Not on file    Gets together: Not on file    Attends religious service: Not on file    Active member of club or organization: Not on file    Attends meetings of clubs or organizations: Not on file    Relationship status: Not on file  . Intimate partner violence:    Fear of current or ex partner: Not on file    Emotionally abused: Not on file    Physically abused: Not on file    Forced sexual activity: Not on file  Other Topics Concern  . Not on file  Social History Narrative  . Not on file  Family History  Problem Relation Age of Onset  . Throat cancer Mother   . Alcohol abuse Father   . Cirrhosis Father   . Pneumonia Father   . Diabetes Neg Hx   . Prostate cancer Neg Hx   . Colon cancer Neg Hx    Current Outpatient Medications on File Prior to Visit  Medication Sig  . acetaminophen (TYLENOL) 500 MG tablet Take by mouth.  . Blood Glucose Monitoring Suppl (CONTOUR NEXT EZ) w/Device KIT 1 kit by Does not apply route 2 (two) times daily.  . Cholecalciferol (VITAMIN D PO) Take by mouth daily.  . Cholecalciferol (VITAMIN D3) 100000 UNIT/GM POWD Take by mouth.  . tamsulosin (FLOMAX) 0.4 MG CAPS capsule Take 1 capsule (0.4 mg total) by mouth daily.   No current facility-administered medications on file prior to visit.     Review of Systems  Constitutional:  Negative for activity change, appetite change, chills, diaphoresis, fatigue and fever.  HENT: Negative for congestion and hearing loss.   Eyes: Negative for visual disturbance.  Respiratory: Negative for cough, chest tightness, shortness of breath and wheezing.   Cardiovascular: Negative for chest pain, palpitations and leg swelling.  Gastrointestinal: Negative for abdominal pain, constipation, diarrhea, nausea and vomiting.  Endocrine: Negative for cold intolerance.  Genitourinary: Negative for dysuria, frequency and hematuria.  Musculoskeletal: Negative for arthralgias and neck pain.  Skin: Negative for rash.  Allergic/Immunologic: Negative for environmental allergies.  Neurological: Negative for dizziness, weakness, light-headedness, numbness and headaches.  Hematological: Negative for adenopathy.  Psychiatric/Behavioral: Negative for behavioral problems, dysphoric mood and sleep disturbance.   Per HPI unless specifically indicated above      Objective:    BP 126/67   Pulse 77   Temp 98 F (36.7 C) (Oral)   Resp 16   Ht _0  (1.753 m)   Wt 190 lb (86.2 kg)   BMI 28.06 kg/m   Wt Readings from Last 3 Encounters:  12/12/18 190 lb (86.2 kg)  07/19/18 180 lb (81.6 kg)  06/22/18 183 lb 6.4 oz (83.2 kg)    Physical Exam Vitals signs and nursing note reviewed.  Constitutional:      General: He is not in acute distress.    Appearance: He is well-developed. He is not diaphoretic.     Comments: Well-appearing, comfortable, cooperative  HENT:     Head: Normocephalic and atraumatic.     Comments: Frontal / maxillary sinuses non-tender. Nares patent without congestion L TM obscured by dry cerumen. R TM only partially obscured but normal  Oropharynx clear without erythema, exudates, edema or asymmetry.  Eyes:     General:        Right eye: No discharge.        Left eye: No discharge.     Conjunctiva/sclera: Conjunctivae normal.     Pupils: Pupils are equal, round, and  reactive to light.  Neck:     Musculoskeletal: Normal range of motion and neck supple.     Thyroid: No thyromegaly.  Cardiovascular:     Rate and Rhythm: Normal rate and regular rhythm.     Heart sounds: Normal heart sounds. No murmur.  Pulmonary:     Effort: Pulmonary effort is normal. No respiratory distress.     Breath sounds: Normal breath sounds. No wheezing or rales.  Abdominal:     General: Bowel sounds are normal. There is no distension.     Palpations: Abdomen is soft. There is no mass.     Tenderness:  There is no abdominal tenderness.  Musculoskeletal: Normal range of motion.        General: No tenderness.     Comments: Upper / Lower Extremities: - Normal muscle tone, strength bilateral upper extremities 5/5, lower extremities 5/5  Lymphadenopathy:     Cervical: No cervical adenopathy.  Skin:    General: Skin is warm and dry.     Findings: No erythema or rash.  Neurological:     Mental Status: He is alert and oriented to person, place, and time.     Comments: Distal sensation intact to light touch all extremities  Psychiatric:        Behavior: Behavior normal.     Comments: Well groomed, good eye contact, normal speech and thoughts    Results for orders placed or performed in visit on 11/11/18  Hepatitis C antibody  Result Value Ref Range   Hepatitis C Ab NON-REACTIVE NON-REACTI   SIGNAL TO CUT-OFF 0.01 <1.00  VITAMIN D 25 Hydroxy (Vit-D Deficiency, Fractures)  Result Value Ref Range   Vit D, 25-Hydroxy 28 (L) 30 - 100 ng/mL  T4, free  Result Value Ref Range   Free T4 1.6 0.8 - 1.8 ng/dL  TSH  Result Value Ref Range   TSH 8.64 (H) 0.40 - 4.50 mIU/L  PSA  Result Value Ref Range   PSA 0.5 < OR = 4.0 ng/mL  Lipid panel  Result Value Ref Range   Cholesterol 280 (H) <200 mg/dL   HDL 40 > OR = 40 mg/dL   Triglycerides 457 (H) <150 mg/dL   LDL Cholesterol (Calc)  mg/dL (calc)   Total CHOL/HDL Ratio 7.0 (H) <5.0 (calc)   Non-HDL Cholesterol (Calc) 240 (H) <130  mg/dL (calc)  COMPLETE METABOLIC PANEL WITH GFR  Result Value Ref Range   Glucose, Bld 120 (H) 65 - 99 mg/dL   BUN 24 7 - 25 mg/dL   Creat 1.34 (H) 0.70 - 1.33 mg/dL   GFR, Est Non African American 58 (L) > OR = 60 mL/min/1.40m   GFR, Est African American 68 > OR = 60 mL/min/1.781m  BUN/Creatinine Ratio 18 6 - 22 (calc)   Sodium 139 135 - 146 mmol/L   Potassium 4.6 3.5 - 5.3 mmol/L   Chloride 100 98 - 110 mmol/L   CO2 28 20 - 32 mmol/L   Calcium 9.7 8.6 - 10.3 mg/dL   Total Protein 7.3 6.1 - 8.1 g/dL   Albumin 4.3 3.6 - 5.1 g/dL   Globulin 3.0 1.9 - 3.7 g/dL (calc)   AG Ratio 1.4 1.0 - 2.5 (calc)   Total Bilirubin 0.4 0.2 - 1.2 mg/dL   Alkaline phosphatase (APISO) 78 35 - 144 U/L   AST 16 10 - 35 U/L   ALT 20 9 - 46 U/L  CBC with Differential/Platelet  Result Value Ref Range   WBC 5.9 3.8 - 10.8 Thousand/uL   RBC 5.11 4.20 - 5.80 Million/uL   Hemoglobin 15.6 13.2 - 17.1 g/dL   HCT 45.7 38.5 - 50.0 %   MCV 89.4 80.0 - 100.0 fL   MCH 30.5 27.0 - 33.0 pg   MCHC 34.1 32.0 - 36.0 g/dL   RDW 12.3 11.0 - 15.0 %   Platelets 215 140 - 400 Thousand/uL   MPV 9.5 7.5 - 12.5 fL   Neutro Abs 2,867 1,500 - 7,800 cells/uL   Lymphs Abs 2,395 850 - 3,900 cells/uL   Absolute Monocytes 537 200 - 950 cells/uL   Eosinophils Absolute  71 15 - 500 cells/uL   Basophils Absolute 30 0 - 200 cells/uL   Neutrophils Relative % 48.6 %   Total Lymphocyte 40.6 %   Monocytes Relative 9.1 %   Eosinophils Relative 1.2 %   Basophils Relative 0.5 %  Hemoglobin A1c  Result Value Ref Range   Hgb A1c MFr Bld 6.6 (H) <5.7 % of total Hgb   Mean Plasma Glucose 143 (calc)   eAG (mmol/L) 7.9 (calc)    Results for orders placed or performed in visit on 12/12/18 (from the past 24 hour(s))  POCT UA - Microalbumin     Status: Normal   Collection Time: 12/13/18  9:09 AM  Result Value Ref Range   Microalbumin Ur, POC 0 mg/L       Assessment & Plan:   Problem List Items Addressed This Visit    Elevated  serum creatinine   Elevated TSH    Repeat lab in 3 months, question if actually hypothyroidism, possibly related to hyperglycemia new dx T2DM and hyperTG  For now defer therapy since new start meds already  Follow-up 3 month, reconsider      Hyperlipidemia associated with type 2 diabetes mellitus ([place])    Uncontrolled cholesterol on elevated TG >450, poor lifestyle Last lipid panel 11/2018 Calculated ASCVD 10 yr risk score elevated  Plan: 1. Offer statin - reconsider in future, re-check lab 6 months 2. Encourage improved lifestyle - low carb/cholesterol, reduce portion size, continue improving regular exercise      Relevant Medications   metFORMIN (GLUCOPHAGE-XR) 500 MG 24 hr tablet   Type 2 diabetes mellitus with other specified complication (HCC)    New diagnosis Type 2 DM, persistent elevated A1c >6.5 on repeat readings No hypoglycemia Complications - hyperglycemia,  hyperlipidemia specifically hypertriglyceridemia, GERD, possible hypothyroidism, OSA - increases risk of future cardiovascular complications   Plan:  1. START Metformin XR '500mg'$  daily - new start today, discussed benefit 2. Encourage improved lifestyle - low carb, low sugar diet, reduce portion size, continue improving regular exercise 3. Check CBG, bring log to next visit for review 4. Check urine microalbumin - future consider ACEi/ARB =  RESULT 0 - negative - Offer statin due to lipids, will reconsider at future visit repeat lab check 5. Next visit DM Foot exam due / Advised to schedule DM ophtho exam, send record - [facility] 6. Follow-up 3 months lab A1c - future consider GLP1 vs SGLT2 if indicated       Relevant Medications   metFORMIN (GLUCOPHAGE-XR) 500 MG 24 hr tablet   glucose blood test strip   Other Relevant Orders   POCT UA - Microalbumin (Completed)    Other Visit Diagnoses    Annual physical exam    -  Primary      Updated Health Maintenance information Reviewed recent lab results with  patient Encouraged improvement to lifestyle with diet and exercise - Goal of weight loss   Meds ordered this encounter  Medications  . metFORMIN (GLUCOPHAGE-XR) 500 MG 24 hr tablet    Sig: Take 1 tablet (500 mg total) by mouth daily with breakfast.    Dispense:  30 tablet    Refill:  3  . glucose blood test strip    Sig: Use as instructed to check blood sugar up to 1-2 times daily    Dispense:  100 each    Refill:  5    Contour Next test strips    Follow up plan: Return in about 3 months (around  03/14/2019) for DM lab result, Foot Exam, TSH Med review.   Future labs ordered for BMET, Cr, A1c, TSH Free - 3 months  Then q 6 month lipid - discuss statin  Nobie Putnam, DO [facility] 12/12/2018, 2:23 PM

## 2018-12-12 NOTE — Patient Instructions (Addendum)
Thank you for coming to the office today.  New diagnosis of Type 2 Diabetes today  START Metformin XR 500mg  once daily with food - may cause some upset stomach loose stool, this will improve with time, we may adjust dose in future for now start with this.  In future we recommend speaking with our nurse health advisor team here at the office, they have not officially started yet and can provide some diabetic education once they are ready.  Thyroid hormone level is low. TSH is up to 8, this means that your body does not have enough thyroid hormone, considered Hypothyroidism. If still low at next check in 3 months, we can offer thyroid medication or a 2nd opinion from thyroid (Endocrine specialist)  Future recommend cholesterol medicine Statin  Your provider would like to you have your annual eye exam. Please contact your current eye doctor or here are some good options for you to contact.   [facility]   Address: [address] [place], [place] [address] Phone: [phone]  Website: [email or web address]  ----------------  Debrox - ear drops, OTC or generic brand, can use 2-3 drops in each ear that is clogged with wax, lay on side to let it soak in, and can use gentle warm water with few drops of hydrogen peroxide mixed in with a bulb syringe to flush it out, if cannot get it then schedule w/ [place] for a Ear Cleaning appointment.   DUE for FASTING BLOOD WORK (no food or drink after midnight before the lab appointment, only water or coffee without cream/sugar on the morning of)  SCHEDULE "Lab Only" visit in the morning at the clinic for lab draw in 3 MONTHS   - Make sure Lab Only appointment is at about 1 week before your next appointment, so that results will be available  For Lab Results, once available within 2-3 days of blood draw, you can can log in to MyChart online to view your results and a brief explanation. Also, we can discuss results at next follow-up visit.   Please  schedule a Follow-up Appointment to: Return in about 3 months (around 03/14/2019) for DM lab result, Foot Exam, TSH Med review.  If you have any other questions or concerns, please feel free to call the office or send a message through MyChart. You may also schedule an earlier appointment if necessary.  Additionally, you may be receiving a survey about your experience at our office within a few days to 1 week by e-mail or mail. We value your feedback.  Saralyn Pilar, DO [facility], [place]

## 2018-12-13 ENCOUNTER — Other Ambulatory Visit: Payer: Self-pay | Admitting: Family Medicine

## 2018-12-13 DIAGNOSIS — R7989 Other specified abnormal findings of blood chemistry: Secondary | ICD-10-CM

## 2018-12-13 DIAGNOSIS — E1169 Type 2 diabetes mellitus with other specified complication: Secondary | ICD-10-CM

## 2018-12-13 LAB — POCT UA - MICROALBUMIN: Microalbumin Ur, POC: 0 mg/L

## 2018-12-13 NOTE — Assessment & Plan Note (Addendum)
New diagnosis Type 2 DM, persistent elevated A1c >6.5 on repeat readings No hypoglycemia Complications - hyperglycemia,  hyperlipidemia specifically hypertriglyceridemia, GERD, possible hypothyroidism, OSA - increases risk of future cardiovascular complications   Plan:  1. START Metformin XR 500mg  daily - new start today, discussed benefit 2. Encourage improved lifestyle - low carb, low sugar diet, reduce portion size, continue improving regular exercise 3. Check CBG, bring log to next visit for review 4. Check urine microalbumin - future consider ACEi/ARB =  RESULT 0 - negative - Offer statin due to lipids, will reconsider at future visit repeat lab check 5. Next visit DM Foot exam due / Advised to schedule DM ophtho exam, send record - [facility] 6. Follow-up 3 months lab A1c - future consider GLP1 vs SGLT2 if indicated

## 2018-12-13 NOTE — Assessment & Plan Note (Signed)
Repeat lab in 3 months, question if actually hypothyroidism, possibly related to hyperglycemia new dx T2DM and hyperTG  For now defer therapy since new start meds already  Follow-up 3 month, reconsider

## 2018-12-13 NOTE — Assessment & Plan Note (Signed)
Uncontrolled cholesterol on elevated TG >450, poor lifestyle Last lipid panel 11/2018 Calculated ASCVD 10 yr risk score elevated  Plan: 1. Offer statin - reconsider in future, re-check lab 6 months 2. Encourage improved lifestyle - low carb/cholesterol, reduce portion size, continue improving regular exercise

## 2019-03-17 DIAGNOSIS — T7840XA Allergy, unspecified, initial encounter: Secondary | ICD-10-CM | POA: Diagnosis not present

## 2019-03-17 DIAGNOSIS — T63421A Toxic effect of venom of ants, accidental (unintentional), initial encounter: Secondary | ICD-10-CM | POA: Diagnosis not present

## 2019-03-17 DIAGNOSIS — E119 Type 2 diabetes mellitus without complications: Secondary | ICD-10-CM | POA: Diagnosis not present

## 2019-03-21 ENCOUNTER — Other Ambulatory Visit: Payer: BLUE CROSS/BLUE SHIELD

## 2019-03-24 ENCOUNTER — Other Ambulatory Visit: Payer: BC Managed Care – PPO

## 2019-03-24 ENCOUNTER — Other Ambulatory Visit: Payer: Self-pay

## 2019-03-24 DIAGNOSIS — E1169 Type 2 diabetes mellitus with other specified complication: Secondary | ICD-10-CM | POA: Diagnosis not present

## 2019-03-24 DIAGNOSIS — R7989 Other specified abnormal findings of blood chemistry: Secondary | ICD-10-CM | POA: Diagnosis not present

## 2019-03-25 LAB — T4, FREE: Free T4: 2 ng/dL — ABNORMAL HIGH (ref 0.8–1.8)

## 2019-03-25 LAB — BASIC METABOLIC PANEL WITH GFR
BUN: 24 mg/dL (ref 7–25)
CO2: 27 mmol/L (ref 20–32)
Calcium: 9.5 mg/dL (ref 8.6–10.3)
Chloride: 100 mmol/L (ref 98–110)
Creat: 1.33 mg/dL (ref 0.70–1.33)
GFR, Est African American: 68 mL/min/{1.73_m2} (ref >=60)
GFR, Est Non African American: 59 mL/min/{1.73_m2} — ABNORMAL LOW (ref >=60)
Glucose, Bld: 106 mg/dL — ABNORMAL HIGH (ref 65–99)
Potassium: 4.4 mmol/L (ref 3.5–5.3)
Sodium: 138 mmol/L (ref 135–146)

## 2019-03-25 LAB — TSH: TSH: 5.57 mIU/L — ABNORMAL HIGH (ref 0.40–4.50)

## 2019-03-25 LAB — HEMOGLOBIN A1C
Hgb A1c MFr Bld: 6.2 % of total Hgb — ABNORMAL HIGH (ref <5.7)
Mean Plasma Glucose: 131 (calc)
eAG (mmol/L): 7.3 (calc)

## 2019-03-28 ENCOUNTER — Ambulatory Visit: Payer: BLUE CROSS/BLUE SHIELD | Admitting: Family Medicine

## 2019-03-28 ENCOUNTER — Encounter: Payer: Self-pay | Admitting: Family Medicine

## 2019-03-28 ENCOUNTER — Other Ambulatory Visit: Payer: Self-pay

## 2019-03-28 VITALS — BP 113/68 | HR 74 | Temp 98.3°F | Resp 16 | Ht 69.0 in | Wt 184.0 lb

## 2019-03-28 DIAGNOSIS — J3089 Other allergic rhinitis: Secondary | ICD-10-CM

## 2019-03-28 DIAGNOSIS — H938X2 Other specified disorders of left ear: Secondary | ICD-10-CM | POA: Diagnosis not present

## 2019-03-28 DIAGNOSIS — E038 Other specified hypothyroidism: Secondary | ICD-10-CM

## 2019-03-28 DIAGNOSIS — H6122 Impacted cerumen, left ear: Secondary | ICD-10-CM | POA: Diagnosis not present

## 2019-03-28 DIAGNOSIS — R7989 Other specified abnormal findings of blood chemistry: Secondary | ICD-10-CM

## 2019-03-28 DIAGNOSIS — E039 Hypothyroidism, unspecified: Secondary | ICD-10-CM | POA: Diagnosis not present

## 2019-03-28 DIAGNOSIS — E1169 Type 2 diabetes mellitus with other specified complication: Secondary | ICD-10-CM | POA: Diagnosis not present

## 2019-03-28 MED ORDER — FLUTICASONE PROPIONATE 50 MCG/ACT NA SUSP: 2.0000 | Freq: Every day | NASAL | 3 refills | Status: DC

## 2019-03-28 MED ORDER — METFORMIN HCL ER 500 MG PO TB24: 500.0000 mg | ORAL_TABLET | Freq: Every day | ORAL | 3 refills | Status: DC

## 2019-03-28 NOTE — Progress Notes (Signed)
Subjective:    Patient ID: Chad Grosslaude N Gill Jr., male    DOB: 05/11/1961, 58 y.o.   MRN: [id number]  Chad GrossClaude N Toledo Jr. is a 58 y.o. male presenting on 03/28/2019 for Diabetes   HPI   CHRONIC DM, Type 2: Reports no concerns, has improved lifestyle. Previous results A1c 7.7 > 6.6 Now recent result improved A1c 6.2 CBGs: Avg 112, Low >100, High < 120. Checks CBGs 1-2x week Meds: Metformin XR 500mg  daily Reports good compliance. Tolerating well w/o side-effects Currently not on ACEi / ARB - last urine micro negative 11/2018 Lifestyle: - Diet (improving diabetic diet, low carb, increased water)  - Exercise (active regular exercising) Due for DM eye Denies hypoglycemia, polyuria, visual changes, numbness or tingling.  Elevated TSH IMproved TSH on last check near normal, slightly elevated Free T4 now. No prior history and never on med  LEFT EAR, cerumen impaction / FULLNESS Reports some fullness within ear, not painful. Admits history earwax. Tried OTC drops without relief Not tried saline spray or flonase Denies any hearing loss or pain  Depression screen Chad Rose 2/9 03/28/2019 12/12/2018 07/19/2018  Decreased Interest 0 0 0  Down, Depressed, Hopeless 0 0 0  PHQ - 2 Score 0 0 0    Social History   Tobacco Use  . Smoking status: Never Smoker  . Smokeless tobacco: Never Used  Substance Use Topics  . Alcohol use: Yes    Alcohol/week: 5.0 standard drinks    Types: 5 Cans of beer per week  . Drug use: No    Review of Systems Per HPI unless specifically indicated above     Objective:    BP 113/68   Pulse 74   Temp 98.3 F (36.8 C) (Oral)   Resp 16   Ht 5\' 9"  (1.753 m)   Wt 184 lb (83.5 kg)   BMI 27.17 kg/m   Wt Readings from Last 3 Encounters:  03/28/19 184 lb (83.5 kg)  12/12/18 190 lb (86.2 kg)  07/19/18 180 lb (81.6 kg)    Physical Exam Vitals signs and nursing note reviewed.  Constitutional:      General: He is not in acute distress.    Appearance: He is  well-developed. He is not diaphoretic.     Comments: Well-appearing, comfortable, cooperative  HENT:     Head: Normocephalic and atraumatic.     Comments: Left ear with visible TM some cloudy opaque with mild fluid, some dry cerumen debris not fully impacted  R side TM normal Eyes:     General:        Right eye: No discharge.        Left eye: No discharge.     Conjunctiva/sclera: Conjunctivae normal.  Cardiovascular:     Rate and Rhythm: Normal rate.  Pulmonary:     Effort: Pulmonary effort is normal.  Skin:    General: Skin is warm and dry.     Findings: No erythema or rash.  Neurological:     Mental Status: He is alert and oriented to person, place, and time.  Psychiatric:        Behavior: Behavior normal.     Comments: Well groomed, good eye contact, normal speech and thoughts       ________________________________________________________ PROCEDURE NOTE Date: 03/28/19 Left Ear Lavage / Cerumen Removal Discussed benefits and risks (including pain / discomforts, dizziness, minor abrasion of ear canal). Verbal consent given by patient. Medication:  carbamide peroxide ear drops, Ear Lavage Solution (warm water +  hydrogen peroxide) Performed by Elvina MattesNishaben Patel CMA and Dr Althea CharonKaramalegos Several drops of carbamide peroxide placed in left ear, allowed to sit for few minutes. Ear lavage solution flushed into LEFT ear in attempt to dislodge and remove ear wax. Results were successful removal of cerumen.  Repeat Ear Exam: - Completely removed cerumen now, with clear ear canals and visible TMs clear and normal appearance. Still residual fullness of TM   Diabetic Foot Exam - Simple   Simple Foot Form Diabetic Foot exam was performed with the following findings: Yes 03/28/2019  8:30 AM  Visual Inspection No deformities, no ulcerations, no other skin breakdown bilaterally: Yes Sensation Testing Intact to touch and monofilament testing bilaterally: Yes Pulse Check Posterior Tibialis  and Dorsalis pulse intact bilaterally: Yes Comments     Results for orders placed or performed in visit on 03/21/19  BASIC METABOLIC PANEL WITH GFR  Result Value Ref Range   Glucose, Bld 106 (H) 65 - 99 mg/dL   BUN 24 7 - 25 mg/dL   Creat 1.611.33 0.960.70 - 0.451.33 mg/dL   GFR, Est Non African American 59 (L) > OR = 60 mL/min/1.6573m2   GFR, Est African American 68 > OR = 60 mL/min/1.7173m2   BUN/Creatinine Ratio NOT APPLICABLE 6 - 22 (calc)   Sodium 138 135 - 146 mmol/L   Potassium 4.4 3.5 - 5.3 mmol/L   Chloride 100 98 - 110 mmol/L   CO2 27 20 - 32 mmol/L   Calcium 9.5 8.6 - 10.3 mg/dL  T4, free  Result Value Ref Range   Free T4 2.0 (H) 0.8 - 1.8 ng/dL  TSH  Result Value Ref Range   TSH 5.57 (H) 0.40 - 4.50 mIU/L  Hemoglobin A1c  Result Value Ref Range   Hgb A1c MFr Bld 6.2 (H) <5.7 % of total Hgb   Mean Plasma Glucose 131 (calc)   eAG (mmol/L) 7.3 (calc)      Assessment & Plan:   Problem List Items Addressed This Visit    Elevated serum creatinine    Persistent elevated Cr Likely new baseline Cr 1.3 approx, mild reduced kidney function CKD-III Review future trend as well with controlled DM May consider ACEi/ARB despite negative microalbumin in future      Elevated TSH   Subclinical hypothyroidism    Improved TSH again nearly normal, had been elevated previously Slightly higher Free T4 now is not supportive of Hypothyroidism Monitor labs No clinical concern at this time Keep track of trend      Type 2 diabetes mellitus with other specified complication (HCC) - Primary    Improved control Type 2 DM with A1c 6.2 now - improved lifestyle No hypoglycemia Complications - hyperglycemia,  hyperlipidemia specifically hypertriglyceridemia, GERD, possible hypothyroidism, OSA - increases risk of future cardiovascular complications   Plan:  1. CONTINUE Metformin XR 500mg  daily 2. Encourage improved lifestyle - low carb, low sugar diet, reduce portion size, continue improving regular  exercise 3. Check CBG, bring log to next visit for review 4. UTD last urine microalbumin 11/2018 negative - Offer statin due to lipids, will reconsider at future visit 5. DM Foot done - Advised to schedule DM ophtho exam, send record 6. Follow-up 6 months lab, consider newer DM medications if interested      Relevant Medications   metFORMIN (GLUCOPHAGE-XR) 500 MG 24 hr tablet    Other Visit Diagnoses    Ear fullness, left       Left ear impacted cerumen  Seasonal allergic rhinitis due to other allergic trigger       Relevant Medications   fluticasone (FLONASE) 50 MCG/ACT nasal spray    Left ear fullness, some possible effusion with opaque appearance of ear drum, no sign of infection Dry cerumen debris partial impacted, flushed with good results Start nasal steroid Flonase 2 sprays in each nostril daily for 4-6 weeks, may repeat course seasonally or as needed F/u if not improved consider ENT    Meds ordered this encounter  Medications  . fluticasone (FLONASE) 50 MCG/ACT nasal spray    Sig: Place 2 sprays into both nostrils daily. Use for 4-6 weeks then stop and use seasonally or as needed.    Dispense:  16 g    Refill:  3  . metFORMIN (GLUCOPHAGE-XR) 500 MG 24 hr tablet    Sig: Take 1 tablet (500 mg total) by mouth daily with breakfast.    Dispense:  90 tablet    Refill:  3    Follow up plan: Return in about 6 months (around 09/27/2019) for 6 months DM A1c.   Nobie Putnam, [facility] 03/28/2019, 8:17 AM

## 2019-03-28 NOTE — Patient Instructions (Addendum)
Thank you for coming to the office today.  Recent Labs    07/20/18 0047 12/05/18 0818 03/24/19 0810  HGBA1C 7.7* 6.6* 6.2*   Keep up the good work  Ear flush today for wax  Start nasal steroid Flonase 2 sprays in each nostril daily for 4-6 weeks, may repeat course seasonally or as needed   If not improving can contact us for referral to ENT  Your provider would like to you have your annual eye exam. Please contact your current eye doctor or here are some good options for you to contact.   [facility]   Address: [address] [place], [place] [address] Phone: [phone]  Website: [email or web address] [address], [place], [place] [address] Phone: [phone] [email or web address]  [facility]  Address: [place], [place], [place] [address] Phone: [phone]   [facility] [address] [place], [place] [address] Phone: [phone]  [facility] Address: [place], [place],  [address]  Phone: [phone]  [place]   Please schedule a Follow-up Appointment to: Return in about 6 months (around 09/27/2019) for 6 months DM A1c.  If you have any other questions or concerns, please feel free to call the office or send a message through [place]. You may also schedule an earlier appointment if necessary.  Additionally, you may be receiving a survey about your experience at our office within a few days to 1 week by e-mail or mail. We value your feedback.  Nobie Putnam, DO [place]

## 2019-03-28 NOTE — Assessment & Plan Note (Signed)
Improved control Type 2 DM with A1c 6.2 now - improved lifestyle No hypoglycemia Complications - hyperglycemia,  hyperlipidemia specifically hypertriglyceridemia, GERD, possible hypothyroidism, OSA - increases risk of future cardiovascular complications   Plan:  1. CONTINUE Metformin XR 500mg  daily 2. Encourage improved lifestyle - low carb, low sugar diet, reduce portion size, continue improving regular exercise 3. Check CBG, bring log to next visit for review 4. UTD last urine microalbumin 11/2018 negative - Offer statin due to lipids, will reconsider at future visit 5. DM Foot done - Advised to schedule DM ophtho exam, send record 6. Follow-up 6 months lab, consider newer DM medications if interested

## 2019-03-28 NOTE — Assessment & Plan Note (Signed)
Persistent elevated Cr Likely new baseline Cr 1.3 approx, mild reduced kidney function CKD-III Review future trend as well with controlled DM May consider ACEi/ARB despite negative microalbumin in future

## 2019-03-28 NOTE — Assessment & Plan Note (Signed)
Improved TSH again nearly normal, had been elevated previously Slightly higher Free T4 now is not supportive of Hypothyroidism Monitor labs No clinical concern at this time Keep track of trend

## 2019-06-23 IMAGING — CR DG CERVICAL SPINE COMPLETE 4+V
7 series · 8 of 8 positions shown · non-contrast
Comparison: None.

CLINICAL DATA: Left radicular pain at C5

EXAM:
CERVICAL SPINE - COMPLETE 4+ VIEW

[c-spine lat]
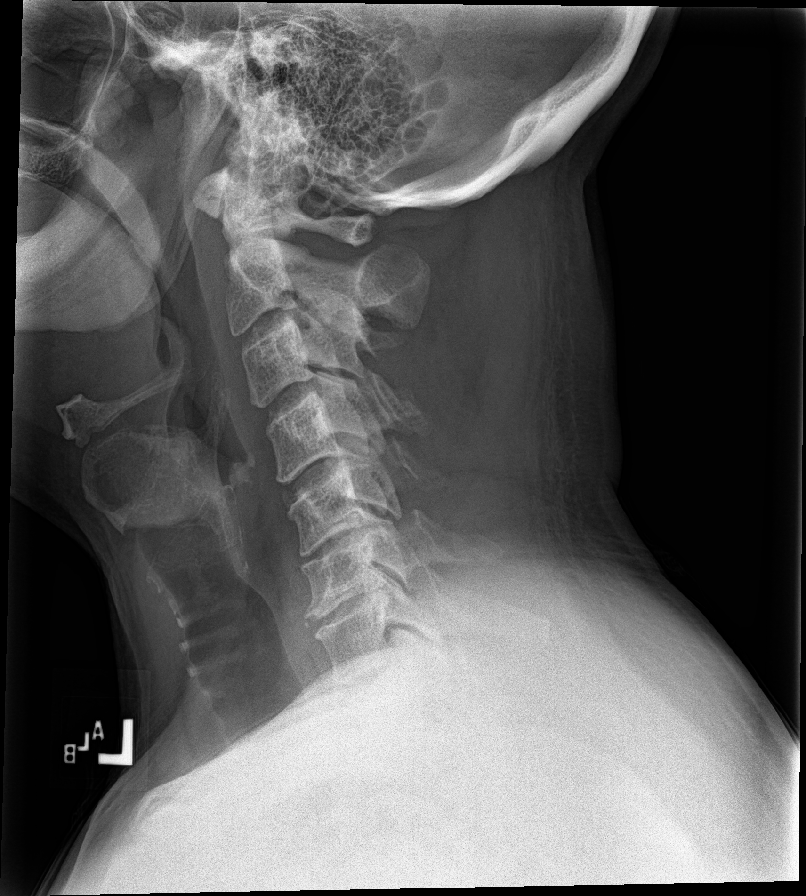

[c-spine obl (1 of 2)]
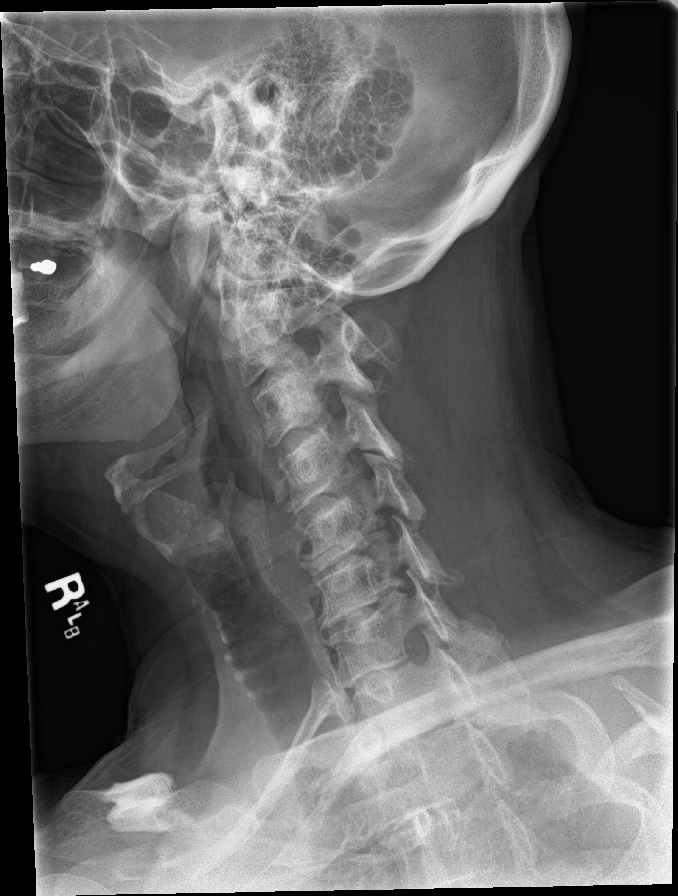

[c-spine obl (2 of 2)]
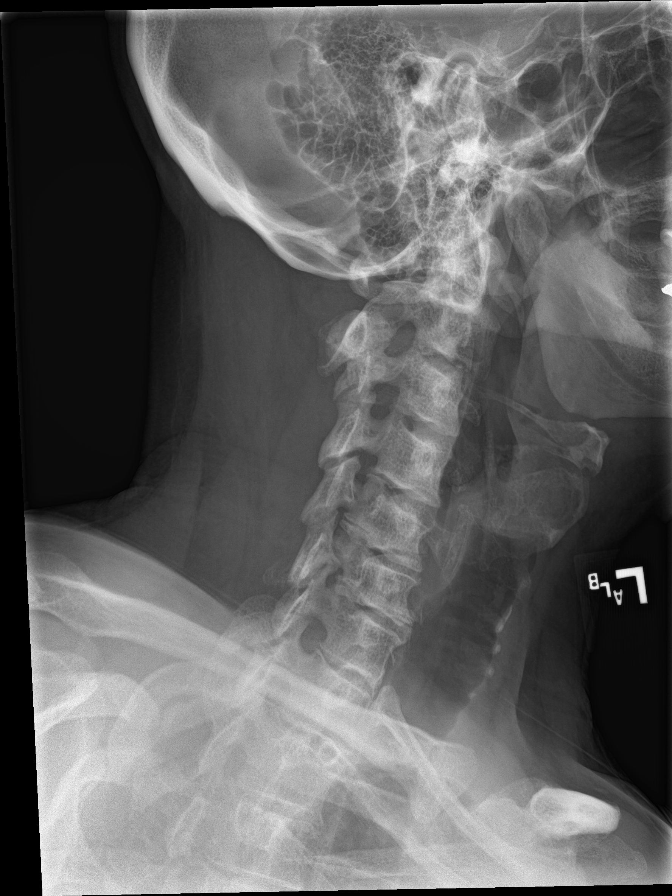

[c-spine ap]
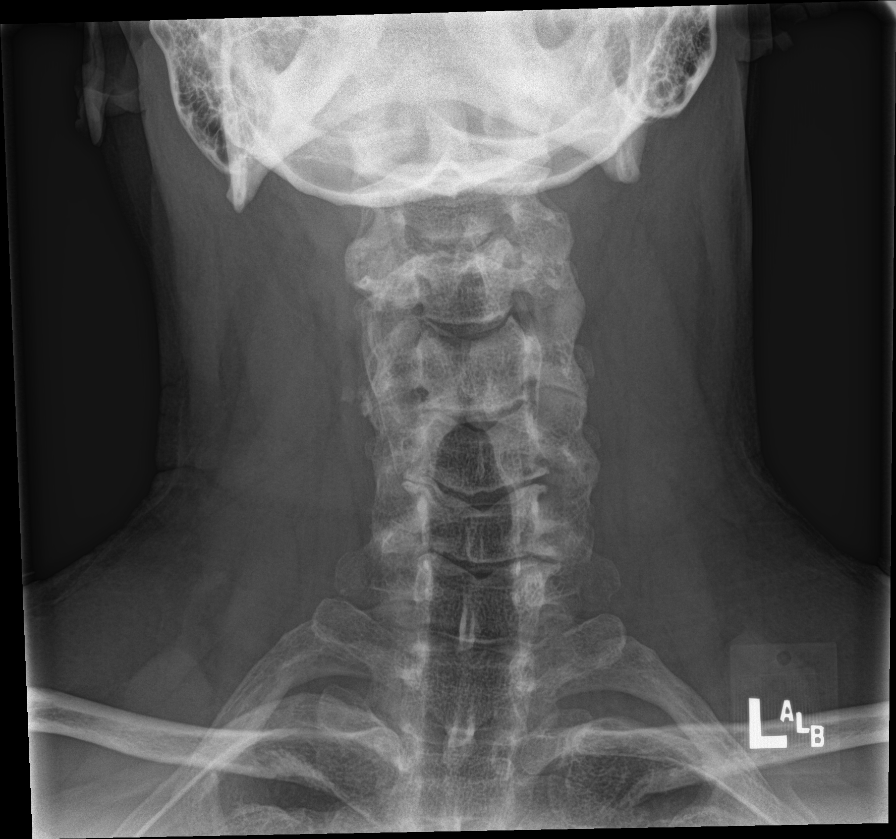

[Series 5: c-spine open mouth · 0.14mm/px · 2 of 2 slices shown]
[im 1/2]
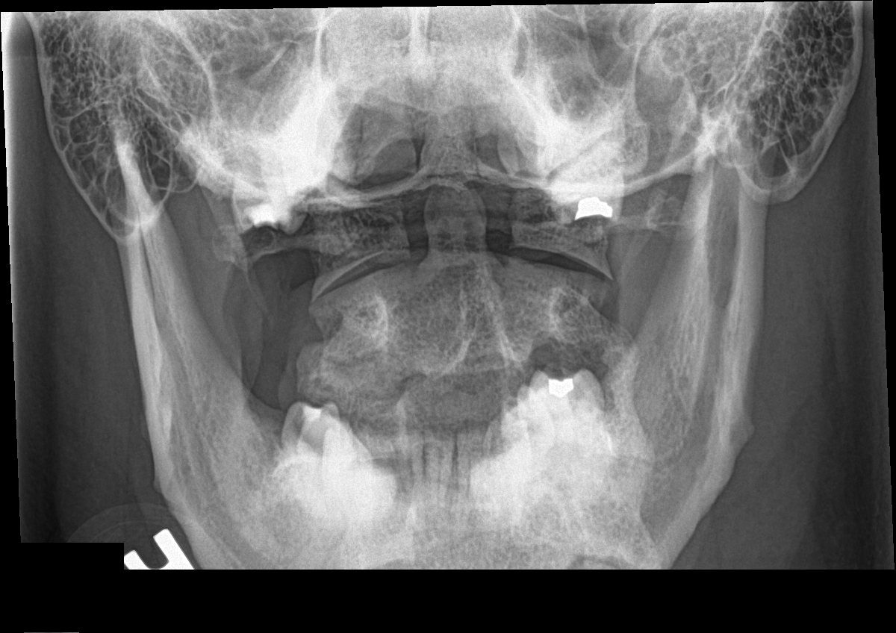
[im 2/2]
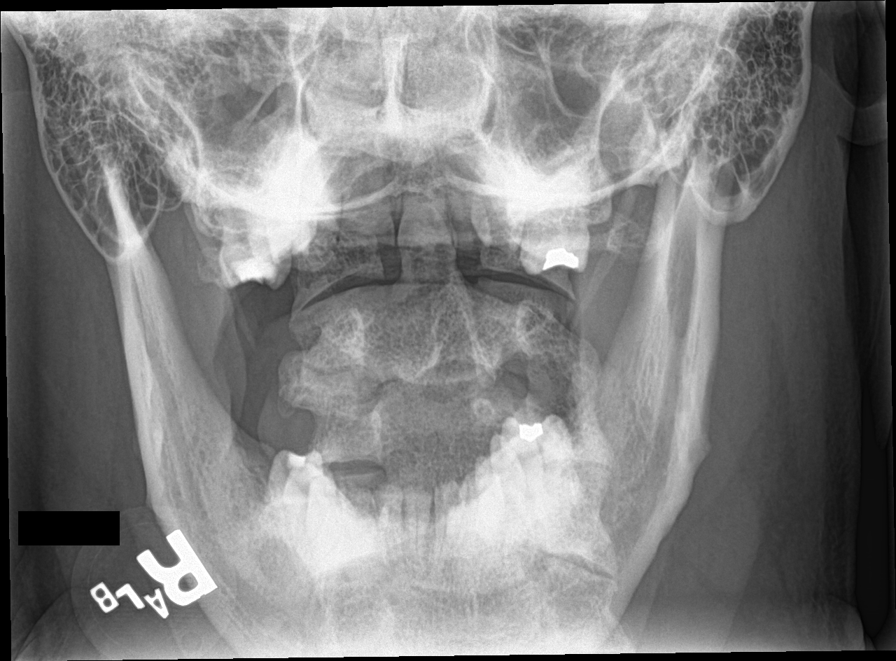

[[person_name]]
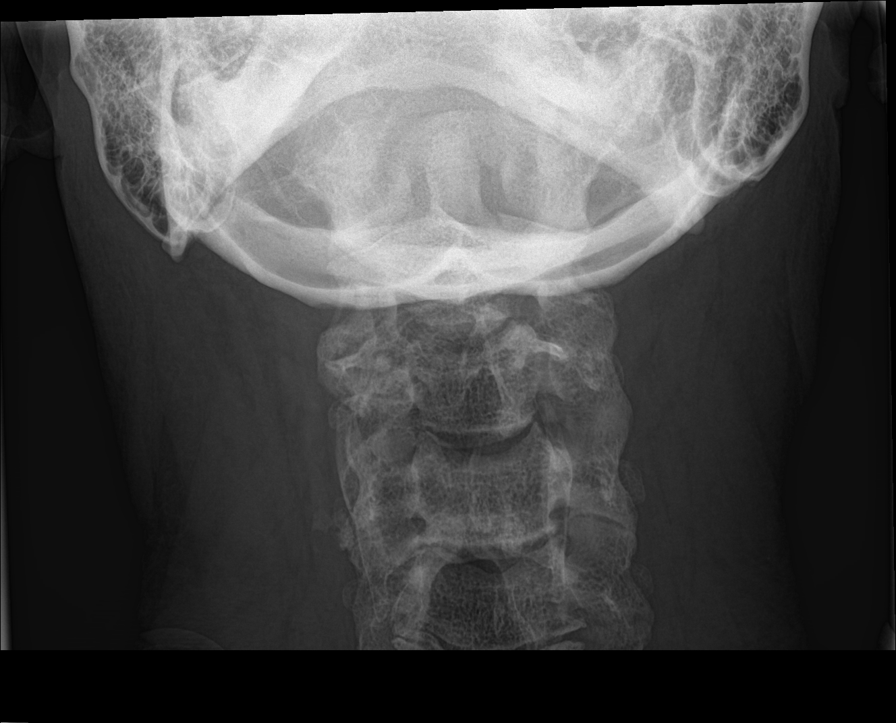

[ct-spine swimmers]
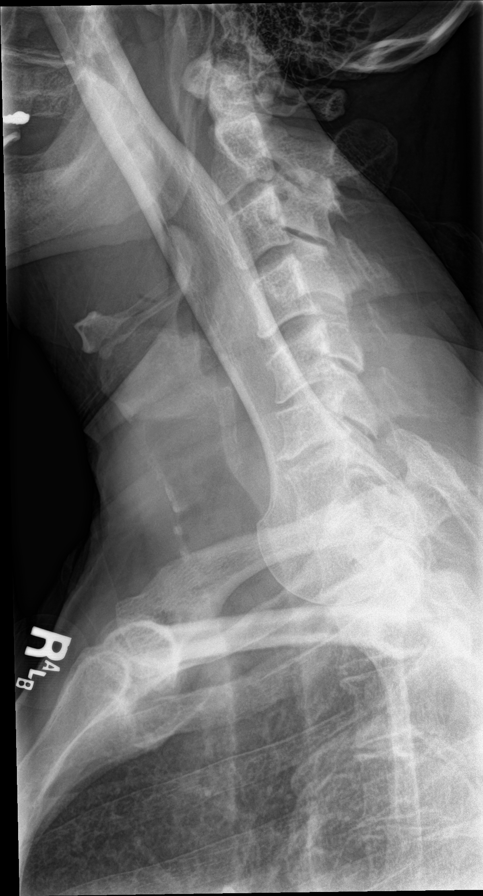

[8 of 8 positions shown; findings below may reference images not displayed]

FINDINGS: Hypoplastic or postoperative spinous processes of C3, C4, and C5, of
indeterminate significance.

Degenerative disc narrowing and uncovertebral ridging primarily at
C5-6 and C6-7, with right more than left foraminal impingement.
Similar but milder changes at C4-5 where stenosis appears greater on
the left.

Facet spurring particularly bulky at C2-3 on the right. No evidence
of fracture, bone lesion, or prevertebral soft tissue thickening.
IMPRESSION: 1. Degenerative bony foraminal impingement at C5-6, C6-7, and to a
lesser extent at C4-5.
2. Prominent C2-3 facet spurring on the right.
3. Hypoplastic or postoperative spinous processes at C3-C5.

## 2019-07-03 ENCOUNTER — Other Ambulatory Visit: Payer: Self-pay

## 2019-07-03 ENCOUNTER — Encounter: Payer: Self-pay | Admitting: Family Medicine

## 2019-07-03 ENCOUNTER — Ambulatory Visit (INDEPENDENT_AMBULATORY_CARE_PROVIDER_SITE_OTHER): Payer: BC Managed Care – PPO | Admitting: Family Medicine

## 2019-07-03 DIAGNOSIS — J0100 Acute maxillary sinusitis, unspecified: Secondary | ICD-10-CM | POA: Diagnosis not present

## 2019-07-03 DIAGNOSIS — R42 Dizziness and giddiness: Secondary | ICD-10-CM | POA: Diagnosis not present

## 2019-07-03 DIAGNOSIS — H9192 Unspecified hearing loss, left ear: Secondary | ICD-10-CM

## 2019-07-03 DIAGNOSIS — G8929 Other chronic pain: Secondary | ICD-10-CM

## 2019-07-03 DIAGNOSIS — H9202 Otalgia, left ear: Secondary | ICD-10-CM | POA: Diagnosis not present

## 2019-07-03 MED ORDER — AMOXICILLIN-POT CLAVULANATE 875-125 MG PO TABS: 1.0000 | ORAL_TABLET | Freq: Two times a day (BID) | ORAL | 0 refills | Status: DC

## 2019-07-03 NOTE — Progress Notes (Signed)
Virtual Visit via Telephone The purpose of this virtual visit is to provide medical care while limiting exposure to the novel coronavirus (COVID19) for both patient and office staff.  Consent was obtained for phone visit:  Yes.   Answered questions that patient had about telehealth interaction:  Yes.   I discussed the limitations, risks, security and privacy concerns of performing an evaluation and management service by telephone. I also discussed with the patient that there may be a patient responsible charge related to this service. The patient expressed understanding and agreed to proceed.  Patient Location: Home Provider Location: [facility])  ---------------------------------------------------------------------- Chief Complaint  Patient presents with  . Ear Problem    left side    S: Reviewed CMA documentation. I have called patient and gathered additional HPI as follows:  LEFT Ear Pain, subacute on Chronic / Left ear hearing reduced / Dizziness vestibular - Last visit with me 03/28/19, for initial visit for same problem, treated with ear lavage flushed dry cerumen and debris for partial impacted ear, and started on Flonase, see prior notes for background information. - Interval update with mild relief only did not resolve issue now worsening - Today patient reports symptoms of persistent Left ear pain is worse and pressure and difficulty hearing on Left with dizziness episodes and off balance. Says it feels like inner ear, the flonase is used daily without resolving issue. He is requesting referral to ENT now. - Admits sinus pressure but without drainage - Denies R side ear symptoms, syncope or passing out  Denies any high risk travel to areas of current concern for COVID19. Denies any known or suspected exposure to person with or possibly with COVID19.  Denies any fevers, chills, sweats, body ache, cough, shortness of breath, sinus pain or pressure,  headache, abdominal pain, diarrhea  Past Medical History:  Diagnosis Date  . Asthma   . Pre-diabetes    Social History   Tobacco Use  . Smoking status: Never Smoker  . Smokeless tobacco: Never Used  Substance Use Topics  . Alcohol use: Yes    Alcohol/week: 5.0 standard drinks    Types: 5 Cans of beer per week  . Drug use: No    Current Outpatient Medications:  .  acetaminophen (TYLENOL) 500 MG tablet, Take by mouth., Disp: , Rfl:  .  Blood Glucose Monitoring Suppl (CONTOUR NEXT EZ) w/Device KIT, 1 kit by Does not apply route 2 (two) times daily., Disp: 1 kit, Rfl: 0 .  Cholecalciferol (VITAMIN D PO), Take by mouth daily., Disp: , Rfl:  .  Cholecalciferol (VITAMIN D3) 100000 UNIT/GM POWD, Take by mouth., Disp: , Rfl:  .  fluticasone (FLONASE) 50 MCG/ACT nasal spray, Place 2 sprays into both nostrils daily. Use for 4-6 weeks then stop and use seasonally or as needed., Disp: 16 g, Rfl: 3 .  glucose blood test strip, Use as instructed to check blood sugar up to 1-2 times daily, Disp: 100 each, Rfl: 5 .  metFORMIN (GLUCOPHAGE-XR) 500 MG 24 hr tablet, Take 1 tablet (500 mg total) by mouth daily with breakfast., Disp: 90 tablet, Rfl: 3 .  tamsulosin (FLOMAX) 0.4 MG CAPS capsule, Take 1 capsule (0.4 mg total) by mouth daily., Disp: 30 capsule, Rfl: 5 .  amoxicillin-clavulanate (AUGMENTIN) 875-125 MG tablet, Take 1 tablet by mouth 2 (two) times daily. For 7 days, Disp: 14 tablet, Rfl: 0  Depression screen [facility] 2/9 07/03/2019 03/28/2019 12/12/2018  Decreased Interest 0 0 0  Down, Depressed,  Hopeless 0 0 0  PHQ - 2 Score 0 0 0    No flowsheet data found.  -------------------------------------------------------------------------- O: No physical exam performed due to remote telephone encounter.  Lab results reviewed.  No results found for this or any previous visit (from the past 2160 hour(s)).  -------------------------------------------------------------------------- A&P:  Problem  List Items Addressed This Visit    None    Visit Diagnoses    Chronic left ear pain    -  Primary   Relevant Orders   Ambulatory referral to ENT   Vestibular dizziness involving left inner ear       Relevant Orders   Ambulatory referral to ENT   Hearing reduced, left       Relevant Orders   Ambulatory referral to ENT   Acute non-recurrent maxillary sinusitis       Relevant Medications   amoxicillin-clavulanate (AUGMENTIN) 875-125 MG tablet     Clinically uncertain etiology of now chronic Left ear pain and fullness, reduced hearing and other constellation of symptoms seems to be inner ear vestibular caused. - Prior cerumen impaction lavage 03/28/19 did not resolve - Flonase ineffective  Plan - referral to [facility] for further diagnostic and management - empiric coverage augmentin for possible sinusitis vs otitis media, last exam 03/28/19 was not consistent, advised this is empiric therapy since not able to examine today and will provide potential coverage if this is causing his symptoms - may continue flonase - Follow-up as advised  Orders Placed This Encounter  Procedures  . Ambulatory referral to ENT    Referral Priority:   Routine    Referral Type:   Consultation    Referral Reason:   Specialty Services Required    Requested Specialty:   Otolaryngology    Number of Visits Requested:   1   .  Meds ordered this encounter  Medications  . amoxicillin-clavulanate (AUGMENTIN) 875-125 MG tablet    Sig: Take 1 tablet by mouth 2 (two) times daily. For 7 days    Dispense:  14 tablet    Refill:  0    Follow-up: - Return in 1 week as needed ear pain dizziness  Patient verbalizes understanding with the above medical recommendations including the limitation of remote medical advice.  Specific follow-up and call-back criteria were given for patient to follow-up or seek medical care more urgently if needed.   - Time spent in direct consultation with patient on phone: 9  minutes   Nobie Putnam, [facility] 07/03/2019, 2:50 PM

## 2019-07-03 NOTE — Patient Instructions (Addendum)
Referral sent to ENT ear specialist  [facility] [address]  [place], [place] [address] Ph: [phone]  Stay tuned for apt - feel free to call them within 1-2 days if not heard back yet  Try Augmentin antibiotic incase of inner ear infection, just to cover one possibility Continue Flonase as you are  Follow-up sooner if cannot wait - can go to Emergency Dept if severe and dizziness or Urgent Care if needed to be seen sooner.  Please schedule a Follow-up Appointment to: Return in about 1 week (around 07/10/2019), or if symptoms worsen or fail to improve, for ear pain.  If you have any other questions or concerns, please feel free to call the office or send a message through [place]. You may also schedule an earlier appointment if necessary.  Additionally, you may be receiving a survey about your experience at our office within a few days to 1 week by e-mail or mail. We value your feedback.  Nobie Putnam, DO [place]

## 2019-07-11 DIAGNOSIS — H6980 Other specified disorders of Eustachian tube, unspecified ear: Secondary | ICD-10-CM | POA: Diagnosis not present

## 2019-07-11 DIAGNOSIS — H9072 Mixed conductive and sensorineural hearing loss, unilateral, left ear, with unrestricted hearing on the contralateral side: Secondary | ICD-10-CM | POA: Diagnosis not present

## 2019-07-11 DIAGNOSIS — H8102 Meniere's disease, left ear: Secondary | ICD-10-CM | POA: Diagnosis not present

## 2019-07-27 ENCOUNTER — Other Ambulatory Visit: Payer: Self-pay | Admitting: Otolaryngology

## 2019-07-27 DIAGNOSIS — H918X2 Other specified hearing loss, left ear: Secondary | ICD-10-CM

## 2019-07-27 DIAGNOSIS — H9042 Sensorineural hearing loss, unilateral, left ear, with unrestricted hearing on the contralateral side: Secondary | ICD-10-CM | POA: Diagnosis not present

## 2019-07-27 DIAGNOSIS — J019 Acute sinusitis, unspecified: Secondary | ICD-10-CM | POA: Diagnosis not present

## 2019-07-27 DIAGNOSIS — IMO0001 Reserved for inherently not codable concepts without codable children: Secondary | ICD-10-CM

## 2019-08-07 ENCOUNTER — Ambulatory Visit
Admission: RE | Admit: 2019-08-07 | Discharge: 2019-08-07 | Disposition: A | Discharge status: Home / Self Care | Payer: BC Managed Care – PPO | Source: Ambulatory Visit | Attending: Otolaryngology | Admitting: Otolaryngology

## 2019-08-07 ENCOUNTER — Other Ambulatory Visit: Payer: Self-pay

## 2019-08-07 DIAGNOSIS — H918X2 Other specified hearing loss, left ear: Secondary | ICD-10-CM | POA: Diagnosis not present

## 2019-08-07 DIAGNOSIS — H9192 Unspecified hearing loss, left ear: Secondary | ICD-10-CM | POA: Diagnosis not present

## 2019-08-07 DIAGNOSIS — IMO0001 Reserved for inherently not codable concepts without codable children: Secondary | ICD-10-CM

## 2019-08-07 LAB — POCT I-STAT CREATININE: Creatinine, Ser: 1.4 mg/dL — ABNORMAL HIGH (ref 0.61–1.24)

## 2019-08-07 MED ORDER — GADOBUTROL 1 MMOL/ML IV SOLN
8.0000 mL | Freq: Once | INTRAVENOUS | Status: AC | PRN
  Administered 2019-08-07: 8 mL via INTRAVENOUS

## 2019-08-28 DIAGNOSIS — H9042 Sensorineural hearing loss, unilateral, left ear, with unrestricted hearing on the contralateral side: Secondary | ICD-10-CM | POA: Diagnosis not present

## 2019-09-05 DIAGNOSIS — H9122 Sudden idiopathic hearing loss, left ear: Secondary | ICD-10-CM | POA: Diagnosis not present

## 2019-09-14 DIAGNOSIS — H9122 Sudden idiopathic hearing loss, left ear: Secondary | ICD-10-CM | POA: Diagnosis not present

## 2019-09-27 ENCOUNTER — Ambulatory Visit (INDEPENDENT_AMBULATORY_CARE_PROVIDER_SITE_OTHER): Payer: BC Managed Care – PPO | Admitting: Family Medicine

## 2019-09-27 ENCOUNTER — Encounter: Payer: Self-pay | Admitting: Family Medicine

## 2019-09-27 ENCOUNTER — Other Ambulatory Visit: Payer: Self-pay

## 2019-09-27 DIAGNOSIS — J209 Acute bronchitis, unspecified: Secondary | ICD-10-CM | POA: Diagnosis not present

## 2019-09-27 MED ORDER — AZITHROMYCIN 250 MG PO TABS: ORAL_TABLET | ORAL | 0 refills | Status: DC

## 2019-09-27 MED ORDER — BENZONATATE 100 MG PO CAPS: 100.0000 mg | ORAL_CAPSULE | Freq: Three times a day (TID) | ORAL | 0 refills | Status: DC | PRN

## 2019-09-27 NOTE — Patient Instructions (Addendum)
We will treat you for bronchitis  Start Azithromycin Z pak (antibiotic) 2 tabs day 1, then 1 tab x 4 days, complete entire course even if improved Start Tessalon Perls take 1 capsule up to 3 times a day as needed for cough  RECOMMENDED self quarantine to [place] - advised to avoid all exposure with others while during treatment. Should continue to quarantine for up to 7-14 days, pending resolution of symptoms, if symptoms resolve by 7 days and is afebrile >3 days - may STOP self quarantine at that time.  If symptoms do not resolve or significantly improve OR if WORSENING - fever / cough - or worsening shortness of breath - then should contact us and seek advice on next steps in treatment at home vs where/when to seek care at Urgent Care or Hospital ED for further intervention  If this does not improve then need further testing  You may have coronavirus / [place]  COVID-19 Testing By Appointment Only  Indoor Test site now. No longer outdoor drive up testing.  Online scheduling can be done online at [email or web address] or by texting "COVID" to [phone].  Test result may take 2-7 days to result. You will be notified by MyChart or by Phone.  Phone: [phone] [facility] contact, can inquire about status of test result)  If negative test - they will call you with result. If abnormal or positive test you will be notified as well and our office will contact you to help further with treatment plan.  Please schedule a Follow-up Appointment to: Return in about 1 week (around 10/04/2019), or if symptoms worsen or fail to improve, for bronchitis.  If you have any other questions or concerns, please feel free to call the office or send a message through [place]. You may also schedule an earlier appointment if necessary.  Additionally, you may be receiving a survey about your experience at our office within a few days to 1 week by e-mail  or mail. We value your feedback.  Nobie Putnam, DO [place]

## 2019-09-27 NOTE — Progress Notes (Signed)
Virtual Visit via Telephone The purpose of this virtual visit is to provide medical care while limiting exposure to the novel coronavirus (COVID19) for both patient and office staff.  Consent was obtained for phone visit:  Yes.   Answered questions that patient had about telehealth interaction:  Yes.   I discussed the limitations, risks, security and privacy concerns of performing an evaluation and management service by telephone. I also discussed with the patient that there may be a patient responsible charge related to this service. The patient expressed understanding and agreed to proceed.  Patient Location: Home Provider Location: [facility])  ---------------------------------------------------------------------- Chief Complaint  Patient presents with  . Sinusitis    onset week mild chills, low grade fever 99.6 F, cough    S: Reviewed CMA documentation. I have called patient and gathered additional HPI as follows:  ACUTE BRONCHITIS / COUGH / LOW GRADE FEVER Reports that symptoms started 1 week ago with lower respiratory cough, some productive cough, chills and low grade fever. He wears mask 100% and he is not exposed to anyone with COVID or other concerns. He is not concerned about COVID at this time. He denies any body aches or other flu like symptoms. He has had similar bronchitis before. - Additionally followed by [facility] Dr Pryor Ochoa and Dr Richardson Landry, received recent steroid injection for ear  Denies any high risk travel to areas of current concern for [place]. Denies any known or suspected exposure to person with or possibly with COVID19.  Denies any sweats, body ache, shortness of breath, sinus pain or pressure, headache, abdominal pain, diarrhea  Past Medical History:  Diagnosis Date  . Asthma   . Pre-diabetes    Social History   Tobacco Use  . Smoking status: Never Smoker  . Smokeless tobacco: Never Used  Substance Use Topics  . Alcohol use:  Yes    Alcohol/week: 5.0 standard drinks    Types: 5 Cans of beer per week  . Drug use: No    Current Outpatient Medications:  .  acetaminophen (TYLENOL) 500 MG tablet, Take by mouth., Disp: , Rfl:  .  Blood Glucose Monitoring Suppl (CONTOUR NEXT EZ) w/Device KIT, 1 kit by Does not apply route 2 (two) times daily., Disp: 1 kit, Rfl: 0 .  Cholecalciferol (VITAMIN D PO), Take by mouth daily., Disp: , Rfl:  .  fluticasone (FLONASE) 50 MCG/ACT nasal spray, Place 2 sprays into both nostrils daily. Use for 4-6 weeks then stop and use seasonally or as needed., Disp: 16 g, Rfl: 3 .  glucose blood test strip, Use as instructed to check blood sugar up to 1-2 times daily, Disp: 100 each, Rfl: 5 .  metFORMIN (GLUCOPHAGE-XR) 500 MG 24 hr tablet, Take 1 tablet (500 mg total) by mouth daily with breakfast., Disp: 90 tablet, Rfl: 3 .  tamsulosin (FLOMAX) 0.4 MG CAPS capsule, Take 1 capsule (0.4 mg total) by mouth daily., Disp: 30 capsule, Rfl: 5 .  azithromycin (ZITHROMAX Z-PAK) 250 MG tablet, Take 2 tabs (518m total) on Day 1. Take 1 tab (2511m daily for next 4 days., Disp: 6 tablet, Rfl: 0 .  benzonatate (TESSALON) 100 MG capsule, Take 1 capsule (100 mg total) by mouth 3 (three) times daily as needed for cough., Disp: 30 capsule, Rfl: 0 .  Cholecalciferol (VITAMIN D3) 100000 UNIT/GM POWD, Take by mouth., Disp: , Rfl:   [facility]2019 07/03/2019 03/28/2019  Decreased Interest 0 0 0  Down, Depressed, Hopeless 0  0 0  PHQ - 2 Score 0 0 0    No flowsheet data found.  -------------------------------------------------------------------------- O: No physical exam performed due to remote telephone encounter.  Lab results reviewed.  Recent Results (from the past 2160 hour(s))  I-STAT creatinine     Status: Abnormal   Collection Time: 08/07/19  1:49 PM  Result Value Ref Range   Creatinine, Ser 1.40 (H) 0.61 - 1.24 mg/dL     -------------------------------------------------------------------------- A&P:  Problem List Items Addressed This Visit    None    Visit Diagnoses    Acute bronchitis, unspecified organism    -  Primary   Relevant Medications   azithromycin (ZITHROMAX Z-PAK) 250 MG tablet   benzonatate (TESSALON) 100 MG capsule     Consistent with worsening bronchitis in setting of likely viral. Concern with duration >1 week, recurrent episode Low grade temp Cannot rule out COVID19, he is lower risk with description of his limited exposures and mask adherence but still discussed only way to determine would be COVID19 test- he has declined today.  Clinically not consistent with influenza either - agree to defer empiric flu treatment for now.  Use COVID19 precautions to avoid potential transmission while treatment. May seek care over weekend for testing if not improving.  Plan: 1 Start Azithromycin Z-pak dosing 573m then 2555mdaily x 4 days 2.  Rx Tessalon Perls for cough PRN 3. He may try OTC - Mucinex, Tylenol/Ibuprofen PRN, Nasal saline, lozenges, tea with honey/lemon Return criteria reviewed, follow-up within 1 week if not improved   Meds ordered this encounter  Medications  . azithromycin (ZITHROMAX Z-PAK) 250 MG tablet    Sig: Take 2 tabs (50062motal) on Day 1. Take 1 tab (250m80maily for next 4 days.    Dispense:  6 tablet    Refill:  0  . benzonatate (TESSALON) 100 MG capsule    Sig: Take 1 capsule (100 mg total) by mouth 3 (three) times daily as needed for cough.    Dispense:  30 capsule    Refill:  0    Follow-up: - Return in 1 week as needed if not improved  Patient verbalizes understanding with the above medical recommendations including the limitation of remote medical advice.  Specific follow-up and call-back criteria were given for patient to follow-up or seek medical care more urgently if needed.   - Time spent in direct consultation with patient on phone: 9  minutes  AlexNobie Putnam SIrvineup 09/27/2019, 3:58 PM

## 2019-10-03 ENCOUNTER — Ambulatory Visit: Payer: BC Managed Care – PPO | Admitting: Family Medicine

## 2020-03-22 ENCOUNTER — Ambulatory Visit: Payer: BC Managed Care – PPO | Admitting: Family Medicine

## 2020-03-22 ENCOUNTER — Other Ambulatory Visit: Payer: Self-pay

## 2020-03-22 ENCOUNTER — Encounter: Payer: Self-pay | Admitting: Family Medicine

## 2020-03-22 VITALS — BP 111/69 | HR 79 | Temp 97.7°F | Resp 16 | Ht 69.0 in | Wt 185.6 lb

## 2020-03-22 DIAGNOSIS — T783XXA Angioneurotic edema, initial encounter: Secondary | ICD-10-CM

## 2020-03-22 DIAGNOSIS — L508 Other urticaria: Secondary | ICD-10-CM

## 2020-03-22 MED ORDER — PREDNISONE 20 MG PO TABS: ORAL_TABLET | ORAL | 0 refills | Status: DC

## 2020-03-22 MED ORDER — HYDROXYZINE HCL 25 MG PO TABS: 25.0000 mg | ORAL_TABLET | Freq: Three times a day (TID) | ORAL | 2 refills | Status: DC | PRN

## 2020-03-22 MED ORDER — TRIAMCINOLONE ACETONIDE 0.5 % EX CREA: 1.0000 "application " | TOPICAL_CREAM | Freq: Two times a day (BID) | CUTANEOUS | 2 refills | Status: DC

## 2020-03-22 MED ORDER — FAMOTIDINE 40 MG PO TABS: 40.0000 mg | ORAL_TABLET | Freq: Two times a day (BID) | ORAL | 2 refills | Status: DC

## 2020-03-22 NOTE — Progress Notes (Signed)
Subjective:    Patient ID: Chad Flash., male    DOB: 1961/08/10, 59 y.o.   MRN: [id number]  Chad Rose. is a 59 y.o. male presenting on 03/22/2020 for Urticaria (from past 2 month and as per pt he lost voice Thursday-- he is allergic to something may be in food)   HPI   RECURRENT URTICARIA / ANGIOEDEMA Episodic Reports he has had something bothering him for past 2 months approximately with episodic reactions with swelling reaction, he said 10 min after eating hibachi, he would develop urticaria and hives swelling itching, different places on body under arms, trunk, legs, eyelid. Worst episode was 2 days ago had swelling of his vocal cords and he lost his voice with very hoarse voice and also had lip swelling. Seems to be occurring more frequently now few times a week, at most about every other day. - Taking Benadryl PRN with some relief, does make him sleepy - Not taking claritin, zyrtec, allegra. 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 Denies chest pain or productive cough, nausea vomiting  Health Maintenance: UTD COVID19 vaccine  Depression screen [facility] 2/9 09/27/2019 07/03/2019 03/28/2019  Decreased Interest 0 0 0  Down, Depressed, Hopeless 0 0 0  PHQ - 2 Score 0 0 0    Social History   Tobacco Use  . Smoking status: Never Smoker  . Smokeless tobacco: Never Used  Vaping Use  . Vaping Use: Never used  Substance Use Topics  . Alcohol use: Yes    Alcohol/week: 5.0 standard drinks    Types: 5 Cans of beer per week  . Drug use: No    Review of Systems Per HPI unless specifically indicated above     Objective:    BP 111/69   Pulse 79   Temp 97.7 F (36.5 C) (Temporal)   Resp 16   Ht 5\' 9"  (1.753 m)   Wt 185 lb 9.6 oz (84.2 kg)   SpO2 98%   BMI 27.41 kg/m   Wt Readings from Last 3 Encounters:  03/22/20 185 lb 9.6 oz  (84.2 kg)  03/28/19 184 lb (83.5 kg)  12/12/18 190 lb (86.2 kg)    Physical Exam Vitals and nursing note reviewed.  Constitutional:      General: He is not in acute distress.    Appearance: He is well-developed. He is not diaphoretic.     Comments: Well-appearing, comfortable, cooperative  HENT:     Head: Normocephalic and atraumatic.  Eyes:     General:        Right eye: No discharge.        Left eye: No discharge.     Conjunctiva/sclera: Conjunctivae normal.  Neck:     Thyroid: No thyromegaly.  Cardiovascular:     Rate and Rhythm: Normal rate and regular rhythm.     Heart sounds: Normal heart sounds. No murmur heard.   Pulmonary:     Effort: Pulmonary effort is normal. No respiratory distress.     Breath sounds: Normal breath sounds. No wheezing or rales.  Musculoskeletal:        General: Normal range of motion.     Cervical back: Normal range of motion and neck supple.  Lymphadenopathy:     Cervical: No cervical adenopathy.  Skin:    General: Skin is warm and dry.     Findings: Rash (  Right upper ext inner upper arm with residual mild raised urticarial rash) present. No erythema.  Neurological:     Mental Status: He is alert and oriented to person, place, and time.  Psychiatric:        Behavior: Behavior normal.     Comments: Well groomed, good eye contact, normal speech and thoughts    Results for orders placed or performed during the hospital encounter of 08/07/19  I-STAT creatinine  Result Value Ref Range   Creatinine, Ser 1.40 (H) 0.61 - 1.24 mg/dL      Assessment & Plan:   Problem List Items Addressed This Visit    None    Visit Diagnoses    Recurrent urticaria    -  Primary   Relevant Medications   famotidine (PEPCID) 40 MG tablet   hydrOXYzine (ATARAX/VISTARIL) 25 MG tablet   predniSONE (DELTASONE) 20 MG tablet   triamcinolone cream (KENALOG) 0.5 %   Other Relevant Orders   Ambulatory referral to Allergy   Allergic angioedema, initial encounter        Relevant Medications   predniSONE (DELTASONE) 20 MG tablet   Other Relevant Orders   Ambulatory referral to Allergy      Recurrent allergic reaction - mostly urticarial hive rash but also has had episode facial lip and vocal cord edema and hoarse voice episodic last onset 2 days ago with resolution with benadryl now. 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 Start anti-histamine blockade - Allegra daily, rx Famotidine 40mg  BID, help prevent recurrence 2. Start Hydroxyzine 25-50mg  TID PRN or can use half pill 12.5mg  if needed, caution sedation. DC Benadryl 3. Start topical Triamcinolone spot treatment 0.5% cream PRN itching and rash 4. Rx Prednisone burst taper over 7 day ONLY if severe episode or needed, can use smaller dose if resolves quickly 5. Referral to Allergist [facility], [facility] for further testing 6. Reviewed dietary exclusions and identifying triggers  Strict return criteria given for acute worsening   Meds ordered this encounter  Medications  . famotidine (PEPCID) 40 MG tablet    Sig: Take 1 tablet (40 mg total) by mouth 2 (two) times daily.    Dispense:  60 tablet    Refill:  2  . hydrOXYzine (ATARAX/VISTARIL) 25 MG tablet    Sig: Take 1-2 tablets (25-50 mg total) by mouth 3 (three) times daily as needed for itching.    Dispense:  30 tablet    Refill:  2  . predniSONE (DELTASONE) 20 MG tablet    Sig: Take daily with food. Start with 60mg  (3 pills) x 2 days, then reduce to 40mg  (2 pills) x 2 days, then 20mg  (1 pill) x 3 days    Dispense:  13 tablet    Refill:  0  . triamcinolone cream (KENALOG) 0.5 %    Sig: Apply 1 application topically 2 (two) times daily. To affected areas, for up to 2 weeks.    Dispense:  30 g    Refill:  2      Follow up plan: Return if symptoms worsen or fail to improve, for 4 weeks if  not improved allergic response hives.   [facility], DO [facility] 03/22/2020, 9:17 AM

## 2020-03-22 NOTE — Patient Instructions (Addendum)
Thank you for coming to the office today.  Urticaria / rash swelling hives  For your lip swelling, this is considered Angioedema, and it is on the mild end of spectrum, since you did not have facial swelling or throat swelling or other systemic symptoms. It can be due to any cause as we discussed, I do not think your trigger is medication or seasonal allergens. Possible food allergen. - Given chronic recurrent nature, we should have you establish with Allergist for further evaluation and testing - START DAILYI ALLERGY PILL - Allegra 180mg  daily preferred or claritin / zyrtec options - every day for few months  Start Famotidine 40mg  twice a day stomach acid blocker that ALSO blocks histamine - can reduce this in future  Use Hydroxyzine instead of benadryl as needed.  Use Triamcinolone cream as spot treatment for itching  Use Prednisone taper or shortened taper ONLY if needed for severe episode  - If acute worsening swelling involving face or throat, difficulty breathing or other systemic symptoms, you should go directly to the Emergency Department, if just recurrent episode but persistent you can notify for trial on Prednisone  Referral to Allergist  [facility] [address] [place],  [address]  [place] Get Driving Directions Main: [place]  Please schedule a Follow-up Appointment to: Return if symptoms worsen or fail to improve, for 4 weeks if not improved allergic response hives.  If you have any other questions or concerns, please feel free to call the office or send a message through MyChart. You may also schedule an earlier appointment if necessary.  Additionally, you may be receiving a survey about your experience at our office within a few days to 1 week by e-mail or mail. We value your feedback.  [id number], DO [facility], Saralyn Pilar

## 2022-12-07 LAB — HM DIABETES EYE EXAM

## 2023-02-16 ENCOUNTER — Ambulatory Visit (INDEPENDENT_AMBULATORY_CARE_PROVIDER_SITE_OTHER): Payer: Commercial Managed Care - PPO | Admitting: Family Medicine

## 2023-02-16 ENCOUNTER — Other Ambulatory Visit: Payer: Self-pay | Admitting: Family Medicine

## 2023-02-16 ENCOUNTER — Encounter: Payer: Self-pay | Admitting: Family Medicine

## 2023-02-16 VITALS — BP 132/72 | HR 80 | Ht 69.0 in | Wt 176.0 lb

## 2023-02-16 DIAGNOSIS — E1169 Type 2 diabetes mellitus with other specified complication: Secondary | ICD-10-CM

## 2023-02-16 DIAGNOSIS — R7989 Other specified abnormal findings of blood chemistry: Secondary | ICD-10-CM

## 2023-02-16 DIAGNOSIS — Z0000 Encounter for general adult medical examination without abnormal findings: Secondary | ICD-10-CM

## 2023-02-16 DIAGNOSIS — E559 Vitamin D deficiency, unspecified: Secondary | ICD-10-CM

## 2023-02-16 DIAGNOSIS — E038 Other specified hypothyroidism: Secondary | ICD-10-CM

## 2023-02-16 DIAGNOSIS — N401 Enlarged prostate with lower urinary tract symptoms: Secondary | ICD-10-CM

## 2023-02-16 LAB — POCT GLYCOSYLATED HEMOGLOBIN (HGB A1C): Hemoglobin A1C: 10.2 % — AB (ref 4.0–5.6)

## 2023-02-16 MED ORDER — RYBELSUS 3 MG PO TABS
3.0000 mg | ORAL_TABLET | Freq: Every day | ORAL | 0 refills | Status: DC
Start: 2023-02-16 — End: 2023-05-25

## 2023-02-16 NOTE — Assessment & Plan Note (Addendum)
Elevated A1c today 10.2, lost to follow-up, off medication metformin No hypoglycemia Complications - hyperglycemia,  hyperlipidemia specifically hypertriglyceridemia, GERD, possible hypothyroidism, OSA - increases risk of future cardiovascular complications   Plan:  Discussion today on GLP1 therapy. Agree to hold off on restarting Metformin his preference Will start Rybelsus 3mg  daily 30 day free sample today, he will check into cost coverage and notify us in 3 weeks we can re order dose inc Rybelsus 7mg  or other option if preferred Encourage improved lifestyle - low carb, low sugar diet, reduce portion size, continue improving regular exercise 3. Check CBG, bring log to next visit for review 4. Future labs and urine micro

## 2023-02-16 NOTE — Patient Instructions (Addendum)
Thank you for coming to the office today.  Starting dose is 3mg  once daily, first thing in morning on empty stomach, sip of water, no other medicines. Wait 30 min before first meal of day.  After 30 days we need to increase dose up to 7 mg - call us to request new rx.  Go to "Rybelsus" website to go to patient savings. www.saveonR.com  https://www.rybelsus.com/savings-and-support.html  Text - READY to 737-042-9571  ------------------------------  Eat at least 3 meals and 1-2 snacks per day (don't skip breakfast).  Aim for no more than 5 hours between eating. - Tip: If you go >5 hours without eating and become very hungry, your body will supply it's own resources temporarily and you can gain extra weight when you eat.  Diet Recommendations for Diabetes   REDUCE Starchy (carb) foods include: Bread, rice, pasta, potatoes, corn, crackers, bagels, muffins, all baked goods.   FRUITS - LIMIT these HIGH sugar/carb fruits = Pineapple, Watermelon, Bananas - OKAY with these MEDIUM sugar/carb fruits = Citrus, Oranges, Grapes - PREFER these LOW sugar/carb fruits = Apples, Berries, Pears, Plums  Protein foods include: Meat, fish, poultry, eggs, dairy foods, and beans such as pinto and kidney beans (beans also provide carbohydrate).   1. Eat at least 3 meals and 1-2 snacks per day. Never go more than 4-5 hours while awake without eating.   2. Limit starchy foods to TWO per meal and ONE per snack. ONE portion of a starchy  food is equal to the following:   - ONE slice of bread (or its equivalent, such as half of a hamburger bun).   - 1/2 cup of a "scoopable" starchy food such as potatoes or rice.   - 1 OUNCE (28 grams) of starchy snacks (crackers or pretzels, look on label).   - 15 grams of carbohydrate as shown on food label.   3. Both lunch and dinner should include a protein food, a carb food, and vegetables.   - Obtain twice as many veg's as protein or carbohydrate foods for both lunch and dinner.    - Try to keep frozen veg's on hand for a quick vegetable serving.     - Fresh or frozen veg's are best.   4. Breakfast should always include protein.      Please schedule a Follow-up Appointment to: Return in about 3 months (around 05/19/2023) for 3 month fasting lab only then 1 week later Annual Physical (DM updates).  If you have any other questions or concerns, please feel free to call the office or send a message through MyChart. You may also schedule an earlier appointment if necessary.  Additionally, you may be receiving a survey about your experience at our office within a few days to 1 week by e-mail or mail. We value your feedback.  Saralyn Pilar, DO [facility], [place]

## 2023-02-16 NOTE — Progress Notes (Signed)
Subjective:    Patient ID: Chad Gross., male    DOB: [birth year], 62 y.o.   MRN: [id number]  Chad Rose. is a 62 y.o. male presenting on 02/16/2023 for Medical Management of Chronic Issues  Last visit 2021. Lost to follow-up. He is returning to care today.  HPI  CHRONIC DM, Type 2: Reports no concerns, has improved lifestyle. Previous results A1c 7.7 > 6.6 range. Had done better. Lowest A1c to 6.2 CBGs: prior range 90-100s, has had 130-150s range reported to me now. Not checking regularly Meds: NONE currently - OFF Metformin XR 500mg  daily, he stopped since he heard a lot of negative things about Metformin Currently not on ACEi / ARB - last urine micro negative 11/2018 Lifestyle: - Diet (improving diabetic diet, low carb) - Exercise (active regular exercising) Due for DM eye, screening Denies hypoglycemia, polyuria, visual changes, numbness or tingling.   Health Maintenance: Due for annual labs screening physical     02/16/2023   11:44 AM 09/27/2019    3:50 PM 07/03/2019    2:13 PM  Depression screen PHQ 2/9  Decreased Interest 0 0 0  Down, Depressed, Hopeless 0 0 0  PHQ - 2 Score 0 0 0    Social History   Tobacco Use   Smoking status: Never   Smokeless tobacco: Never  Vaping Use   Vaping Use: Never used  Substance Use Topics   Alcohol use: Yes    Alcohol/week: 5.0 standard drinks of alcohol    Types: 5 Cans of beer per week   Drug use: No   Family History  Problem Relation Age of Onset   Throat cancer Mother    Alcohol abuse Father    Cirrhosis Father    Pneumonia Father    Diabetes Neg Hx    Prostate cancer Neg Hx    Colon cancer Neg Hx      Review of Systems Per HPI unless specifically indicated above     Objective:    BP 132/72   Pulse 80   Ht 5\' 9"  (1.753 m)   Wt 176 lb (79.8 kg)   SpO2 97%   BMI 25.99 kg/m   Wt Readings from Last 3 Encounters:  02/16/23 176 lb (79.8 kg)  03/22/20 185 lb 9.6 oz (84.2 kg)  03/28/19 184 lb  (83.5 kg)    Physical Exam Vitals and nursing note reviewed.  Constitutional:      General: He is not in acute distress.    Appearance: Normal appearance. He is well-developed. He is not diaphoretic.     Comments: Well-appearing, comfortable, cooperative  HENT:     Head: Normocephalic and atraumatic.  Eyes:     General:        Right eye: No discharge.        Left eye: No discharge.     Conjunctiva/sclera: Conjunctivae normal.  Cardiovascular:     Rate and Rhythm: Normal rate.  Pulmonary:     Effort: Pulmonary effort is normal.  Skin:    General: Skin is warm and dry.     Findings: No erythema or rash.  Neurological:     Mental Status: He is alert and oriented to person, place, and time.  Psychiatric:        Mood and Affect: Mood normal.        Behavior: Behavior normal.        Thought Content: Thought content normal.     Comments: Well groomed, good  eye contact, normal speech and thoughts      Results for orders placed or performed in visit on 02/16/23  POCT HgB A1C  Result Value Ref Range   Hemoglobin A1C 10.2 (A) 4.0 - 5.6 %      Assessment & Plan:   Problem List Items Addressed This Visit     Type 2 diabetes mellitus with other specified complication (HCC) - Primary    Elevated A1c today 10.2, lost to follow-up, off medication metformin No hypoglycemia Complications - hyperglycemia,  hyperlipidemia specifically hypertriglyceridemia, GERD, possible hypothyroidism, OSA - increases risk of future cardiovascular complications   Plan:  Discussion today on GLP1 therapy. Agree to hold off on restarting Metformin his preference Will start Rybelsus 3mg  daily 30 day free sample today, he will check into cost coverage and notify us in 3 weeks we can re order dose inc Rybelsus 7mg  or other option if preferred Encourage improved lifestyle - low carb, low sugar diet, reduce portion size, continue improving regular exercise 3. Check CBG, bring log to next visit for review 4.  Future labs and urine micro      Relevant Medications   RYBELSUS 3 MG TABS   Other Relevant Orders   POCT HgB A1C (Completed)    Meds ordered this encounter  Medications   RYBELSUS 3 MG TABS    Sig: Take 1 tablet (3 mg total) by mouth daily.    Dispense:  30 tablet    Refill:  0      Follow up plan: Return in about 3 months (around 05/19/2023) for 3 month fasting lab only then 1 week later Annual Physical (DM updates).  Future labs ordered for 05/19/23 CMET LIPID A1c CBC TSH T4 PSA Vit D Urine Microalbumin  Saralyn Pilar, DO [facility] 02/16/2023, 11:46 AM

## 2023-02-16 NOTE — Addendum Note (Signed)
Addended by: Smitty Cords on: 02/16/2023 01:15 PM   Modules accepted: Orders

## 2023-02-18 ENCOUNTER — Encounter: Payer: Self-pay | Admitting: Family Medicine

## 2023-05-19 ENCOUNTER — Other Ambulatory Visit: Payer: Commercial Managed Care - PPO

## 2023-05-19 DIAGNOSIS — N401 Enlarged prostate with lower urinary tract symptoms: Secondary | ICD-10-CM

## 2023-05-19 DIAGNOSIS — Z0000 Encounter for general adult medical examination without abnormal findings: Secondary | ICD-10-CM

## 2023-05-19 DIAGNOSIS — E559 Vitamin D deficiency, unspecified: Secondary | ICD-10-CM

## 2023-05-19 DIAGNOSIS — R7989 Other specified abnormal findings of blood chemistry: Secondary | ICD-10-CM

## 2023-05-19 DIAGNOSIS — E038 Other specified hypothyroidism: Secondary | ICD-10-CM

## 2023-05-19 DIAGNOSIS — E1169 Type 2 diabetes mellitus with other specified complication: Secondary | ICD-10-CM

## 2023-05-20 LAB — COMPLETE METABOLIC PANEL WITH GFR
AG Ratio: 1.5 (calc) (ref 1.0–2.5)
ALT: 23 U/L (ref 9–46)
AST: 17 U/L (ref 10–35)
Albumin: 4.5 g/dL (ref 3.6–5.1)
Alkaline phosphatase (APISO): 86 U/L (ref 35–144)
BUN: 19 mg/dL (ref 7–25)
CO2: 26 mmol/L (ref 20–32)
Calcium: 9.6 mg/dL (ref 8.6–10.3)
Chloride: 101 mmol/L (ref 98–110)
Creat: 1.24 mg/dL (ref 0.70–1.35)
Globulin: 3 g/dL (ref 1.9–3.7)
Glucose, Bld: 226 mg/dL — ABNORMAL HIGH (ref 65–99)
Potassium: 4.4 mmol/L (ref 3.5–5.3)
Sodium: 137 mmol/L (ref 135–146)
Total Bilirubin: 0.4 mg/dL (ref 0.2–1.2)
Total Protein: 7.5 g/dL (ref 6.1–8.1)
eGFR: 66 mL/min/{1.73_m2} (ref >=60)

## 2023-05-20 LAB — LIPID PANEL
Cholesterol: 261 mg/dL — ABNORMAL HIGH (ref <200)
HDL: 41 mg/dL (ref >=40)
LDL Cholesterol (Calc): 155 mg/dL — ABNORMAL HIGH
Non-HDL Cholesterol (Calc): 220 mg/dL — ABNORMAL HIGH (ref <130)
Total CHOL/HDL Ratio: 6.4 (calc) — ABNORMAL HIGH (ref <5.0)
Triglycerides: 385 mg/dL — ABNORMAL HIGH (ref <150)

## 2023-05-20 LAB — CBC WITH DIFFERENTIAL/PLATELET
Absolute Monocytes: 515 {cells}/uL (ref 200–950)
Basophils Absolute: 31 {cells}/uL (ref 0–200)
Basophils Relative: 0.6 %
Eosinophils Absolute: 82 {cells}/uL (ref 15–500)
Eosinophils Relative: 1.6 %
HCT: 44.9 % (ref 38.5–50.0)
Hemoglobin: 14.9 g/dL (ref 13.2–17.1)
Lymphs Abs: 1872 cells/uL (ref 850–3900)
MCH: 30.7 pg (ref 27.0–33.0)
MCHC: 33.2 g/dL (ref 32.0–36.0)
MCV: 92.6 fL (ref 80.0–100.0)
MPV: 9.5 fL (ref 7.5–12.5)
Monocytes Relative: 10.1 %
Neutro Abs: 2601 {cells}/uL (ref 1500–7800)
Neutrophils Relative %: 51 %
Platelets: 192 10*3/uL (ref 140–400)
RBC: 4.85 10*6/uL (ref 4.20–5.80)
RDW: 12.6 % (ref 11.0–15.0)
Total Lymphocyte: 36.7 %
WBC: 5.1 10*3/uL (ref 3.8–10.8)

## 2023-05-20 LAB — MICROALBUMIN / CREATININE URINE RATIO
Creatinine, Urine: 103 mg/dL (ref 20–320)
Microalb Creat Ratio: 3 mg/g{creat} (ref <30)
Microalb, Ur: 0.3 mg/dL

## 2023-05-20 LAB — TSH: TSH: 6.24 m[IU]/L — ABNORMAL HIGH (ref 0.40–4.50)

## 2023-05-20 LAB — VITAMIN D 25 HYDROXY (VIT D DEFICIENCY, FRACTURES): Vit D, 25-Hydroxy: 39 ng/mL (ref 30–100)

## 2023-05-20 LAB — HEMOGLOBIN A1C
Hgb A1c MFr Bld: 9.8 %{Hb} — ABNORMAL HIGH (ref <5.7)
Mean Plasma Glucose: 235 mg/dL
eAG (mmol/L): 13 mmol/L

## 2023-05-20 LAB — PSA: PSA: 0.31 ng/mL (ref <=4.00)

## 2023-05-20 LAB — T4, FREE: Free T4: 1.5 ng/dL (ref 0.8–1.8)

## 2023-05-21 ENCOUNTER — Encounter: Payer: Self-pay | Admitting: Internal Medicine

## 2023-05-21 ENCOUNTER — Ambulatory Visit (INDEPENDENT_AMBULATORY_CARE_PROVIDER_SITE_OTHER): Payer: Commercial Managed Care - PPO | Admitting: Internal Medicine

## 2023-05-21 VITALS — BP 138/82 | HR 83 | Temp 96.8°F | Wt 175.0 lb

## 2023-05-21 DIAGNOSIS — M25542 Pain in joints of left hand: Secondary | ICD-10-CM

## 2023-05-21 DIAGNOSIS — R3915 Urgency of urination: Secondary | ICD-10-CM

## 2023-05-21 DIAGNOSIS — N401 Enlarged prostate with lower urinary tract symptoms: Secondary | ICD-10-CM | POA: Diagnosis not present

## 2023-05-21 DIAGNOSIS — R351 Nocturia: Secondary | ICD-10-CM

## 2023-05-21 DIAGNOSIS — R350 Frequency of micturition: Secondary | ICD-10-CM

## 2023-05-21 DIAGNOSIS — M25541 Pain in joints of right hand: Secondary | ICD-10-CM

## 2023-05-21 DIAGNOSIS — E1169 Type 2 diabetes mellitus with other specified complication: Secondary | ICD-10-CM | POA: Diagnosis not present

## 2023-05-21 DIAGNOSIS — R3911 Hesitancy of micturition: Secondary | ICD-10-CM

## 2023-05-21 MED ORDER — TAMSULOSIN HCL 0.4 MG PO CAPS: 0.4000 mg | ORAL_CAPSULE | Freq: Every day | ORAL | 0 refills | Status: DC

## 2023-05-21 MED ORDER — METFORMIN HCL 1000 MG PO TABS: ORAL_TABLET | ORAL | 0 refills | Status: DC

## 2023-05-21 MED ORDER — MELOXICAM 7.5 MG PO TABS: 7.5000 mg | ORAL_TABLET | Freq: Every day | ORAL | 0 refills | Status: DC

## 2023-05-21 NOTE — Progress Notes (Signed)
Subjective:    Patient ID: Chad Gross., male    DOB: Jun 12, 1961, 62 y.o.   MRN: [id number]  HPI  Patient presents to the clinic today with complaint of urinary urgency, frequency, hesitancy and nocturia.  He denies burning sensation, dysuria, penile lesion, penile discharge, testicular pain or swelling.  He is able to obtain an erection but has difficulty maintaining it.  He has a history of BPH.  He is not currently taking any medications for this.  He also has a history of DM2, last A1c 9.2%, 04/2023.  He is not taking any oral diabetic medication at this time.  He also reports joint pain in hands.  He reports this is constant and sometimes causes him difficulty sleeping at night.  He has not noticed any joint swelling.  He denies numbness, tingling or weakness of his upper extremities.  He has no family history of rheumatoid arthritis.  He has tried a joint supplement and Tylenol OTC with minimal relief of symptoms.   Review of Systems     Past Medical History:  Diagnosis Date   Asthma    Pre-diabetes     Current Outpatient Medications  Medication Sig Dispense Refill   acetaminophen (TYLENOL) 500 MG tablet Take by mouth.     Blood Glucose Monitoring Suppl (CONTOUR NEXT EZ) w/Device KIT 1 kit by Does not apply route 2 (two) times daily. 1 kit 0   glucose blood test strip Use as instructed to check blood sugar up to 1-2 times daily 100 each 5   RYBELSUS 3 MG TABS Take 1 tablet (3 mg total) by mouth daily. 30 tablet 0   No current facility-administered medications for this visit.    No Known Allergies  Family History  Problem Relation Age of Onset   Throat cancer Mother    Alcohol abuse Father    Cirrhosis Father    Pneumonia Father    Diabetes Neg Hx    Prostate cancer Neg Hx    Colon cancer Neg Hx     Social History   Socioeconomic History   Marital status: Married    Spouse name: Not on file   Number of children: 3   Years of education: High School    Highest education level: 12th grade  Occupational History   Occupation: Nurse, mental health    Comment: [place] / [place]  Tobacco Use   Smoking status: Never   Smokeless tobacco: Never  Vaping Use   Vaping status: Never Used  Substance and Sexual Activity   Alcohol use: Yes    Alcohol/week: 5.0 standard drinks of alcohol    Types: 5 Cans of beer per week   Drug use: No   Sexual activity: Not on file  Other Topics Concern   Not on file  Social History Narrative   Not on file   Social Determinants of Health   Financial Resource Strain: Low Risk  (05/20/2023)   Overall Financial Resource Strain (CARDIA)    Difficulty of Paying Living Expenses: Not very hard  Food Insecurity: No Food Insecurity (05/20/2023)   Hunger Vital Sign    Worried About Running Out of Food in the Last Year: Never true    Ran Out of Food in the Last Year: Never true  Transportation Needs: No Transportation Needs (05/20/2023)   PRAPARE - Administrator, Civil Service (Medical): No    Lack of Transportation (Non-Medical): No  Physical Activity: Insufficiently Active (05/20/2023)   Exercise Vital  Sign    Days of Exercise per Week: 2 days    Minutes of Exercise per Session: 20 min  Stress: Stress Concern Present (05/20/2023)   [employer] of Occupational Health - Occupational Stress Questionnaire    Feeling of Stress : To some extent  Social Connections: Moderately Integrated (05/20/2023)   Social Connection and Isolation Panel [NHANES]    Frequency of Communication with Friends and Family: More than three times a week    Frequency of Social Gatherings with Friends and Family: Twice a week    Attends Religious Services: More than 4 times per year    Active Member of [employer] or Organizations: No    Attends Engineer, structural: Not on file    Marital Status: Married  Catering manager Violence: Not on file     Constitutional: Denies fever, malaise, fatigue, headache or abrupt weight  changes.  HEENT: Denies eye pain, eye redness, ear pain, ringing in the ears, wax buildup, runny nose, nasal congestion, bloody nose, or sore throat. Respiratory: Denies difficulty breathing, shortness of breath, cough or sputum production.   Cardiovascular: Denies chest pain, chest tightness, palpitations or swelling in the hands or feet.  Gastrointestinal: Denies abdominal pain, bloating, constipation, diarrhea or blood in the stool.  GU: Patient reports urinary urgency, frequency, hesitancy and nocturia.  Denies pain with urination, burning sensation, blood in urine, odor or discharge. Musculoskeletal: Patient reports joint pain in hands.  Denies decrease in range of motion, difficulty with gait, muscle pain or joint swelling.  Skin: Denies redness, rashes, lesions or ulcercations.  Neurological: Patient reports insomnia, tingling in feet.  Denies dizziness, difficulty with memory, difficulty with speech or problems with balance and coordination.  Psych: Denies anxiety, depression, SI/HI.  No other specific complaints in a complete review of systems (except as listed in HPI above).  Objective:   Physical Exam   BP 138/82 (BP Location: Right Arm, Patient Position: Sitting, Cuff Size: Normal)   Pulse 83   Temp (!) 96.8 F (36 C) (Temporal)   Wt 175 lb (79.4 kg)   SpO2 96%   BMI 25.84 kg/m   Wt Readings from Last 3 Encounters:  02/16/23 176 lb (79.8 kg)  03/22/20 185 lb 9.6 oz (84.2 kg)  03/28/19 184 lb (83.5 kg)    General: Appears his stated age, overweight in NAD. Skin: Warm, dry and intact. No ulcerations noted. HEENT: Head: normal shape and size; Eyes: sclera white, no icterus, conjunctiva pink, PERRLA and EOMs intact;  Cardiovascular: Normal rate and rhythm. S1,S2 noted.  No murmur, rubs or gallops noted. No JVD or BLE edema.  Pulmonary/Chest: Normal effort and positive vesicular breath sounds. No respiratory distress. No wheezes, rales or ronchi noted.  Abdomen: Soft and  nontender. Normal bowel sounds.  Rectal: Deferred.  He will be seeing his PCP for a physical next week.   Musculoskeletal: Normal flexion and extension of the fingers.  Slight joint enlargement noted of the fingers.  No joint swelling noted.  Handgrips equal.  No difficulty with gait.  Neurological: Alert and oriented.Coordination normal.    BMET    Component Value Date/Time   NA 137 05/19/2023 0757   NA 135 (L) 09/29/2013 1322   K 4.4 05/19/2023 0757   K 3.8 09/29/2013 1322   CL 101 05/19/2023 0757   CL 103 09/29/2013 1322   CO2 26 05/19/2023 0757   CO2 28 09/29/2013 1322   GLUCOSE 226 (H) 05/19/2023 0757   GLUCOSE 170 (H) 09/29/2013  1322   BUN 19 05/19/2023 0757   BUN 21 (H) 09/29/2013 1322   CREATININE 1.24 05/19/2023 0757   CALCIUM 9.6 05/19/2023 0757   CALCIUM 8.9 09/29/2013 1322   GFRNONAA 59 (L) 03/24/2019 0810   GFRAA 68 03/24/2019 0810    Lipid Panel     Component Value Date/Time   CHOL 261 (H) 05/19/2023 0757   TRIG 385 (H) 05/19/2023 0757   HDL 41 05/19/2023 0757   CHOLHDL 6.4 (H) 05/19/2023 0757   LDLCALC 155 (H) 05/19/2023 0757    CBC    Component Value Date/Time   WBC 5.1 05/19/2023 0757   RBC 4.85 05/19/2023 0757   HGB 14.9 05/19/2023 0757   HGB 14.4 09/29/2013 1322   HCT 44.9 05/19/2023 0757   HCT 41.9 09/29/2013 1322   PLT 192 05/19/2023 0757   PLT 194 09/29/2013 1322   MCV 92.6 05/19/2023 0757   MCV 88 09/29/2013 1322   MCH 30.7 05/19/2023 0757   MCHC 33.2 05/19/2023 0757   RDW 12.6 05/19/2023 0757   RDW 13.5 09/29/2013 1322   LYMPHSABS 1,872 05/19/2023 0757   EOSABS 82 05/19/2023 0757   BASOSABS 31 05/19/2023 0757    Hgb A1C Lab Results  Component Value Date   HGBA1C 9.8 (H) 05/19/2023           Assessment & Plan:  Joint pain in hands:  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  He is eating 2 pieces of bread and hashbrowns for breakfast as well as typically fast food for lunch.  Advised him to cut out bread and starches. Follow-up with your PCP as previously scheduled Chad Reaper, NP

## 2023-05-21 NOTE — Patient Instructions (Signed)

## 2023-05-24 ENCOUNTER — Ambulatory Visit: Payer: Self-pay

## 2023-05-24 DIAGNOSIS — E1165 Type 2 diabetes mellitus with hyperglycemia: Secondary | ICD-10-CM

## 2023-05-24 DIAGNOSIS — E1169 Type 2 diabetes mellitus with other specified complication: Secondary | ICD-10-CM

## 2023-05-24 NOTE — Telephone Encounter (Signed)
Chief Complaint: Questions about Metformin Symptoms: diarrhea, stomach feels upset, felt off when stood up but it got better Frequency: Since starting Metformin 1000 mg daily on Friday, diarrhea x 1 each day since Friday Pertinent Negatives: Patient denies other symptoms Disposition: [] ED /[] Urgent Care (no appt availability in office) / [] Appointment(In office/virtual)/ []  [facility]/ [] Home Care/ [] Refused Recommended Disposition /[] [facility]/ [x]  Follow-up with PCP Additional Notes: He asks does metformin cause diarrhea. Advised that is a side effect, but I will send this to Dr. Althea Charon for review and someone will call with his recommendation. He says he has an appointment on Wednesday.    Summary: Cannot tolerate metFORMIN (GLUCOPHAGE) 1000 MG tablet, diarrhea   The patient called in stating he cannot tolerate the metFORMIN (GLUCOPHAGE) 1000 MG tablet. He states it is tearing his stomach up. He states he is having diarrhea at least once a day. Please assist patient further as he has a follow up with his provider on Wednesday         Reason for Disposition  [1] Caller has NON-URGENT medicine question about med that PCP prescribed AND [2] triager unable to answer question  Answer Assessment - Initial Assessment Questions 1. NAME of MEDICINE: "What medicine(s) are you calling about?"     Metformin 2. QUESTION: "What is your question?" (e.g., double dose of medicine, side effect)     Diarrhea all weekend since starting this medication on Friday 3. PRESCRIBER: "Who prescribed the medicine?" Reason: if prescribed by specialist, call should be referred to that group.     [employer] 4. SYMPTOMS: "Do you have any symptoms?" If Yes, ask: "What symptoms are you having?"  "How bad are the symptoms (e.g., mild, moderate, severe)     Diarrhea once a day, unsettled stomach  Protocols used: Medication Question Call-A-AH

## 2023-05-25 MED ORDER — RYBELSUS 7 MG PO TABS: 7.0000 mg | ORAL_TABLET | Freq: Every day | ORAL | 1 refills | Status: DC

## 2023-05-25 MED ORDER — FREESTYLE LIBRE 3 SENSOR MISC
12 refills | Status: DC
Start: 2023-05-25 — End: 2023-05-27

## 2023-05-25 NOTE — Telephone Encounter (Signed)
Called patient,. Keep apt tomorrow A1c 9.8, high reading He ran out of Rybelsus 3mg  back in June 2024, he finished 30 day but was unaware to call back for further refills on higher dose 7mg  Re order Rybelsus 7 mg today He has GI intolerance on metformin we talked dosing options for now, keep it on board He request Freestyle Libre Sensor I will try to order but cannot guarantee it is covered  Saralyn Pilar, DO [facility] 05/25/2023, 12:16 PM

## 2023-05-25 NOTE — Telephone Encounter (Signed)
Please review.  KP

## 2023-05-26 ENCOUNTER — Encounter: Payer: Self-pay | Admitting: Family Medicine

## 2023-05-26 ENCOUNTER — Telehealth: Payer: Self-pay | Admitting: Family Medicine

## 2023-05-26 ENCOUNTER — Ambulatory Visit (INDEPENDENT_AMBULATORY_CARE_PROVIDER_SITE_OTHER): Payer: Commercial Managed Care - PPO | Admitting: Family Medicine

## 2023-05-26 VITALS — BP 110/68 | Ht 69.0 in | Wt 175.0 lb

## 2023-05-26 DIAGNOSIS — E785 Hyperlipidemia, unspecified: Secondary | ICD-10-CM

## 2023-05-26 DIAGNOSIS — M25541 Pain in joints of right hand: Secondary | ICD-10-CM

## 2023-05-26 DIAGNOSIS — R3915 Urgency of urination: Secondary | ICD-10-CM

## 2023-05-26 DIAGNOSIS — E1169 Type 2 diabetes mellitus with other specified complication: Secondary | ICD-10-CM | POA: Diagnosis not present

## 2023-05-26 DIAGNOSIS — N401 Enlarged prostate with lower urinary tract symptoms: Secondary | ICD-10-CM | POA: Diagnosis not present

## 2023-05-26 DIAGNOSIS — E038 Other specified hypothyroidism: Secondary | ICD-10-CM

## 2023-05-26 DIAGNOSIS — M25542 Pain in joints of left hand: Secondary | ICD-10-CM

## 2023-05-26 DIAGNOSIS — E1165 Type 2 diabetes mellitus with hyperglycemia: Secondary | ICD-10-CM

## 2023-05-26 DIAGNOSIS — Z0000 Encounter for general adult medical examination without abnormal findings: Secondary | ICD-10-CM

## 2023-05-26 DIAGNOSIS — R351 Nocturia: Secondary | ICD-10-CM

## 2023-05-26 MED ORDER — ROSUVASTATIN CALCIUM 10 MG PO TABS: 10.0000 mg | ORAL_TABLET | Freq: Every day | ORAL | 3 refills | Status: DC

## 2023-05-26 NOTE — Assessment & Plan Note (Signed)
Slightly elevated TSH Normal T4 Not on therapy Monitor

## 2023-05-26 NOTE — Assessment & Plan Note (Signed)
Improved chronic BPH LUTS nocturia on alpha blocker Tamsulosin PSA negative - Last DRE reported mild BPH or enlarged few year ago - No known personal/family history of prostate CA  Plan: 1. Continue Tamsulosin 0.4mg  daily, consider dose inc or other med Consider Urologist if needed

## 2023-05-26 NOTE — Progress Notes (Signed)
Subjective:    Patient ID: Chad Gross., male    DOB: 1961-05-30, 62 y.o.   MRN: [id number]  Chad Konda. is a 62 y.o. male presenting on 05/26/2023 for Annual Exam and Diabetes   HPI  Here for Annual Physical and Lab Review  CHRONIC DM, Type 2: A1c 9.8, lately has not been controlled CBG rare check 170+ Meds: Metformin IR 500mg  twice a day (half of 1000mg  tabs) - Off Rybelsus, finished 3mg  months ago, did not get 7mg , and now high cost >$1000 Currently not on ACEi / ARB - last urine micro negative 11/2018 Lifestyle: - Diet (improving diabetic diet, low carb) - Exercise (active regular exercising) Diabetic Eye Exam done 02/16/23 Admits foot tingling Denies hypoglycemia, polyuria, visual changes, numbness or tingling.  HYPERLIPIDEMIA: Elevated LDL >150s, TG >350s Not on statin.  Subclinical Hypothyroidism Past trend 5+ years, now with still elevated TSH >6, but normal Free T4 Not on therapy  Additional concerns.  Arthritis hands, chronic problem  BPH Insomnia, poor sleep Related to nocturia He is on Tamsulosin 0.4mg  daily, some improved urinary flow but not impacting his night time symptoms   Health Maintenance: Last Colonoscopy 04/21/18, good for 10 years.  PSA 0.31 negative, 2024, last 0.5 4 yr ago     05/26/2023   10:52 AM 02/16/2023   11:44 AM 09/27/2019    3:50 PM  Depression screen PHQ 2/9  Decreased Interest 0 0 0  Down, Depressed, Hopeless 3 0 0  PHQ - 2 Score 3 0 0  Altered sleeping 3    Tired, decreased energy 3    Change in appetite 0    Feeling bad or failure about yourself  0    Trouble concentrating 0    Moving slowly or fidgety/restless 0    Suicidal thoughts 0    PHQ-9 Score 9        05/26/2023   10:52 AM 02/16/2023   11:44 AM  GAD 7 : Generalized Anxiety Score  Nervous, Anxious, on Edge 0 0  Control/stop worrying 0 0  Worry too much - different things 0 0  Trouble relaxing 0 0  Restless 0 0  Easily annoyed or  irritable 0 0  Afraid - awful might happen 0 0  Total GAD 7 Score 0 0      Past Medical History:  Diagnosis Date   Asthma    Pre-diabetes    Past Surgical History:  Procedure Laterality Date   BACK SURGERY     COLONOSCOPY WITH PROPOFOL N/A 04/21/2018   Procedure: COLONOSCOPY WITH PROPOFOL;  Surgeon: Midge Minium, MD;  Location: [facility];  Service: Endoscopy;  Laterality: N/A;   POLYPECTOMY  04/21/2018   Procedure: POLYPECTOMY;  Surgeon: Midge Minium, MD;  Location: [facility];  Service: Endoscopy;;   Social History   Socioeconomic History   Marital status: Married    Spouse name: Not on file   Number of children: 3   Years of education: High School   Highest education level: 12th grade  Occupational History   Occupation: Nurse, mental health    Comment: [employer] [place] [employer] [place]  Tobacco Use   Smoking status: Never   Smokeless tobacco: Never  Vaping Use   Vaping status: Never Used  Substance and Sexual Activity   Alcohol use: Yes    Alcohol/week: 5.0 standard drinks of alcohol    Types: 5 Cans of beer per week   Drug use: No   Sexual activity: Not  on file  Other Topics Concern   Not on file  Social History Narrative   Not on file   Social Determinants of Health   Financial Resource Strain: Low Risk  (05/20/2023)   Overall Financial Resource Strain (CARDIA)    Difficulty of Paying Living Expenses: Not very hard  Food Insecurity: No Food Insecurity (05/20/2023)   Hunger Vital Sign    Worried About Running Out of Food in the Last Year: Never true    Ran Out of Food in the Last Year: Never true  Transportation Needs: No Transportation Needs (05/20/2023)   PRAPARE - Administrator, Civil Service (Medical): No    Lack of Transportation (Non-Medical): No  Physical Activity: Insufficiently Active (05/20/2023)   Exercise Vital Sign    Days of Exercise per Week: 2 days    Minutes of Exercise per Session: 20 min  Stress: Stress Concern Present  (05/20/2023)   [employer] of Occupational Health - Occupational Stress Questionnaire    Feeling of Stress : To some extent  Social Connections: Moderately Integrated (05/20/2023)   Social Connection and Isolation Panel [NHANES]    Frequency of Communication with Friends and Family: More than three times a week    Frequency of Social Gatherings with Friends and Family: Twice a week    Attends Religious Services: More than 4 times per year    Active Member of [employer] or Organizations: No    Attends Engineer, structural: Not on file    Marital Status: Married  Catering manager Violence: Not on file   Family History  Problem Relation Age of Onset   Throat cancer Mother    Alcohol abuse Father    Cirrhosis Father    Pneumonia Father    Diabetes Neg Hx    Prostate cancer Neg Hx    Colon cancer Neg Hx    Current Outpatient Medications on File Prior to Visit  Medication Sig   Continuous Glucose Sensor (FREESTYLE LIBRE 3 SENSOR) MISC Place 1 sensor on the skin every 14 days. Use to check glucose continuously   metFORMIN (GLUCOPHAGE) 1000 MG tablet Take 0.5 tablets (500 mg total) by mouth 2 (two) times daily with a meal for 7 days, THEN 1 tablet (1,000 mg total) 2 (two) times daily with a meal.   tamsulosin (FLOMAX) 0.4 MG CAPS capsule Take 1 capsule (0.4 mg total) by mouth daily.   Blood Glucose Monitoring Suppl (CONTOUR NEXT EZ) w/Device KIT 1 kit by Does not apply route 2 (two) times daily. (Patient not taking: Reported on 05/21/2023)   glucose blood test strip Use as instructed to check blood sugar up to 1-2 times daily (Patient not taking: Reported on 05/21/2023)   No current facility-administered medications on file prior to visit.    Review of Systems  Constitutional:  Negative for activity change, appetite change, chills, diaphoresis, fatigue and fever.  HENT:  Negative for congestion and hearing loss.   Eyes:  Negative for visual disturbance.  Respiratory:  Negative for  cough, chest tightness, shortness of breath and wheezing.   Cardiovascular:  Negative for chest pain, palpitations and leg swelling.  Gastrointestinal:  Negative for abdominal pain, constipation, diarrhea, nausea and vomiting.  Genitourinary:  Negative for dysuria, frequency and hematuria.  Musculoskeletal:  Positive for arthralgias. Negative for neck pain.  Skin:  Negative for rash.  Neurological:  Negative for dizziness, weakness, light-headedness, numbness and headaches.  Hematological:  Negative for adenopathy.  Psychiatric/Behavioral:  Negative for behavioral  problems, dysphoric mood and sleep disturbance.    Per HPI unless specifically indicated above      Objective:    BP 110/68   Ht 5\' 9"  (1.753 m)   Wt 175 lb (79.4 kg)   BMI 25.84 kg/m   Wt Readings from Last 3 Encounters:  05/26/23 175 lb (79.4 kg)  05/21/23 175 lb (79.4 kg)  02/16/23 176 lb (79.8 kg)    Physical Exam Vitals and nursing note reviewed.  Constitutional:      General: He is not in acute distress.    Appearance: He is well-developed. He is not diaphoretic.     Comments: Well-appearing, comfortable, cooperative  HENT:     Head: Normocephalic and atraumatic.  Eyes:     General:        Right eye: No discharge.        Left eye: No discharge.     Conjunctiva/sclera: Conjunctivae normal.     Pupils: Pupils are equal, round, and reactive to light.  Neck:     Thyroid: No thyromegaly.     Vascular: No carotid bruit.  Cardiovascular:     Rate and Rhythm: Normal rate and regular rhythm.     Pulses: Normal pulses.     Heart sounds: Normal heart sounds. No murmur heard. Pulmonary:     Effort: Pulmonary effort is normal. No respiratory distress.     Breath sounds: Normal breath sounds. No wheezing or rales.  Abdominal:     General: Bowel sounds are normal. There is no distension.     Palpations: Abdomen is soft. There is no mass.     Tenderness: There is no abdominal tenderness.  Musculoskeletal:         General: No tenderness. Normal range of motion.     Cervical back: Normal range of motion and neck supple.     Right lower leg: No edema.     Left lower leg: No edema.     Comments: Upper / Lower Extremities: - Normal muscle tone, strength bilateral upper extremities 5/5, lower extremities 5/5  Lymphadenopathy:     Cervical: No cervical adenopathy.  Skin:    General: Skin is warm and dry.     Findings: No erythema or rash.  Neurological:     Mental Status: He is alert and oriented to person, place, and time.     Comments: Distal sensation intact to light touch all extremities  Psychiatric:        Mood and Affect: Mood normal.        Behavior: Behavior normal.        Thought Content: Thought content normal.     Comments: Well groomed, good eye contact, normal speech and thoughts     Diabetic Foot Exam - Simple   Simple Foot Form Diabetic Foot exam was performed with the following findings: Yes 05/26/2023  1:07 PM  Visual Inspection No deformities, no ulcerations, no other skin breakdown bilaterally: Yes Sensation Testing Intact to touch and monofilament testing bilaterally: Yes Pulse Check Posterior Tibialis and Dorsalis pulse intact bilaterally: Yes Comments      Results for orders placed or performed in visit on 05/19/23  Urine Microalbumin w/creat. ratio  Result Value Ref Range   Creatinine, Urine 103 20 - 320 mg/dL   Microalb, Ur 0.3 mg/dL   Microalb Creat Ratio 3 <30 mg/g creat  VITAMIN D 25 Hydroxy (Vit-D Deficiency, Fractures)  Result Value Ref Range   Vit D, 25-Hydroxy 39 30 - 100 ng/mL  T4, free  Result Value Ref Range   Free T4 1.5 0.8 - 1.8 ng/dL  TSH  Result Value Ref Range   TSH 6.24 (H) 0.40 - 4.50 mIU/L  PSA  Result Value Ref Range   PSA 0.31 < OR = 4.00 ng/mL  Lipid panel  Result Value Ref Range   Cholesterol 261 (H) <200 mg/dL   HDL 41 > OR = 40 mg/dL   Triglycerides 696 (H) <150 mg/dL   LDL Cholesterol (Calc) 155 (H) mg/dL (calc)   Total  CHOL/HDL Ratio 6.4 (H) <5.0 (calc)   Non-HDL Cholesterol (Calc) 220 (H) <130 mg/dL (calc)  Hemoglobin E9B  Result Value Ref Range   Hgb A1c MFr Bld 9.8 (H) <5.7 % of total Hgb   Mean Plasma Glucose 235 mg/dL   eAG (mmol/L) 28.4 mmol/L  CBC with Differential/Platelet  Result Value Ref Range   WBC 5.1 3.8 - 10.8 Thousand/uL   RBC 4.85 4.20 - 5.80 Million/uL   Hemoglobin 14.9 13.2 - 17.1 g/dL   HCT 13.2 44.0 - 10.2 %   MCV 92.6 80.0 - 100.0 fL   MCH 30.7 27.0 - 33.0 pg   MCHC 33.2 32.0 - 36.0 g/dL   RDW 72.5 36.6 - 44.0 %   Platelets 192 140 - 400 Thousand/uL   MPV 9.5 7.5 - 12.5 fL   Neutro Abs 2,601 1,500 - 7,800 cells/uL   Lymphs Abs 1,872 850 - 3,900 cells/uL   Absolute Monocytes 515 200 - 950 cells/uL   Eosinophils Absolute 82 15 - 500 cells/uL   Basophils Absolute 31 0 - 200 cells/uL   Neutrophils Relative % 51 %   Total Lymphocyte 36.7 %   Monocytes Relative 10.1 %   Eosinophils Relative 1.6 %   Basophils Relative 0.6 %  COMPLETE METABOLIC PANEL WITH GFR  Result Value Ref Range   Glucose, Bld 226 (H) 65 - 99 mg/dL   BUN 19 7 - 25 mg/dL   Creat 3.47 4.25 - 9.56 mg/dL   eGFR 66 > OR = 60 LO/VFI/4.33I9   BUN/Creatinine Ratio SEE NOTE: 6 - 22 (calc)   Sodium 137 135 - 146 mmol/L   Potassium 4.4 3.5 - 5.3 mmol/L   Chloride 101 98 - 110 mmol/L   CO2 26 20 - 32 mmol/L   Calcium 9.6 8.6 - 10.3 mg/dL   Total Protein 7.5 6.1 - 8.1 g/dL   Albumin 4.5 3.6 - 5.1 g/dL   Globulin 3.0 1.9 - 3.7 g/dL (calc)   AG Ratio 1.5 1.0 - 2.5 (calc)   Total Bilirubin 0.4 0.2 - 1.2 mg/dL   Alkaline phosphatase (APISO) 86 35 - 144 U/L   AST 17 10 - 35 U/L   ALT 23 9 - 46 U/L      Assessment & Plan:   Problem List Items Addressed This Visit     Benign prostatic hyperplasia with nocturia    Improved chronic BPH LUTS nocturia on alpha blocker Tamsulosin PSA negative - Last DRE reported mild BPH or enlarged few year ago - No known personal/family history of prostate CA  Plan: 1.  Continue Tamsulosin 0.4mg  daily, consider dose inc or other med Consider Urologist if needed      Hyperlipidemia associated with type 2 diabetes mellitus (HCC)    Uncontrolled cholesterol on elevated TG >350 and LDL 150+ The 10-year ASCVD risk score (Arnett DK, et al., 2019) is: 19.2%  Plan: 1. Start new Statin therapy Rx Rosuvastatin 10mg  nightly, counsel on benefits  risk side effects 2. Encourage improved lifestyle - low carb/cholesterol, reduce portion size, continue improving regular exercise      Relevant Medications   rosuvastatin (CRESTOR) 10 MG tablet   Other Relevant Orders   Ambulatory referral to diabetic education   AMB Referral to Pharmacy Medication Management   Subclinical hypothyroidism    Slightly elevated TSH Normal T4 Not on therapy Monitor      Type 2 diabetes mellitus with other specified complication (HCC)    Elevated A1c still 9.8, uncontrolled Side effects GI on Metformin on low dose High cost / adherence barriers to other meds No hypoglycemia Complications - hyperglycemia,  hyperlipidemia specifically hypertriglyceridemia, GERD, possible hypothyroidism, OSA - increases risk of future cardiovascular complications   Plan:  Continue Metformin IR 500mg  twice a day for now, avoid dose inc due to GI side effects Unable to get Rybelsus 7mg  due to cost Will order Clinical Pharmacy referral - already dicsussed case with Gentry Fitz. Goal to identify opportunity for GLP SGLT2 DPP4 that may be affordable for patient. Many med options are listed as preferred that I can see, but he may have other barriers. Encourage improved lifestyle - low carb, low sugar diet, reduce portion size, continue improving regular exercise Sample CGM DexcomG7 trial, but may be out of pocket cost      Relevant Medications   rosuvastatin (CRESTOR) 10 MG tablet   Other Relevant Orders   Ambulatory referral to diabetic education   AMB Referral to Pharmacy Medication Management   Other  Visit Diagnoses     Annual physical exam    -  Primary   Joint pain in both hands       Urinary urgency       Hyperglycemia due to diabetes mellitus (HCC)       Relevant Medications   rosuvastatin (CRESTOR) 10 MG tablet   Other Relevant Orders   Ambulatory referral to diabetic education      Updated Health Maintenance information Reviewed recent lab results with patient Encouraged improvement to lifestyle with diet and exercise Goal of weight loss    Orders Placed This Encounter  Procedures   Ambulatory referral to diabetic education    Referral Priority:   Routine    Referral Type:   Consultation    Referral Reason:   Specialty Services Required    Number of Visits Requested:   1   AMB Referral to Pharmacy Medication Management    Referral Priority:   Routine    Referral Type:   Consultation    Referral Reason:   Pharmacy Medication Management    Number of Visits Requested:   1       Meds ordered this encounter  Medications   rosuvastatin (CRESTOR) 10 MG tablet    Sig: Take 1 tablet (10 mg total) by mouth at bedtime.    Dispense:  90 tablet    Refill:  3      Follow up plan: Return in about 3 months (around 08/26/2023) for 3 month DM A1c, Arthritis, BPH Sleep.  Saralyn Pilar, DO [facility] 05/26/2023, 11:12 AM

## 2023-05-26 NOTE — Assessment & Plan Note (Signed)
Elevated A1c still 9.8, uncontrolled Side effects GI on Metformin on low dose High cost / adherence barriers to other meds No hypoglycemia Complications - hyperglycemia,  hyperlipidemia specifically hypertriglyceridemia, GERD, possible hypothyroidism, OSA - increases risk of future cardiovascular complications   Plan:  Continue Metformin IR 500mg  twice a day for now, avoid dose inc due to GI side effects Unable to get Rybelsus 7mg  due to cost Will order Clinical Pharmacy referral - already dicsussed case with Gentry Fitz. Goal to identify opportunity for GLP SGLT2 DPP4 that may be affordable for patient. Many med options are listed as preferred that I can see, but he may have other barriers. Encourage improved lifestyle - low carb, low sugar diet, reduce portion size, continue improving regular exercise Sample CGM DexcomG7 trial, but may be out of pocket cost

## 2023-05-26 NOTE — Telephone Encounter (Signed)
Chad Rose with [facility] reports that they do not have the Continuous Glucose Sensor (FREESTYLE LIBRE 3 SENSOR) MISC and wanted to ask if a new Rx for FREESTYLE LIBRE 3 + SENSOR could be sent to them.

## 2023-05-26 NOTE — Assessment & Plan Note (Signed)
Uncontrolled cholesterol on elevated TG >350 and LDL 150+ The 10-year ASCVD risk score (Arnett DK, et al., 2019) is: 19.2%  Plan: 1. Start new Statin therapy Rx Rosuvastatin 10mg  nightly, counsel on benefits risk side effects 2. Encourage improved lifestyle - low carb/cholesterol, reduce portion size, continue improving regular exercise

## 2023-05-26 NOTE — Patient Instructions (Addendum)
Thank you for coming to the office today.  Gentry Fitz will call you about the medication insurance options for Diabetes  Continue Flomax 0.4mg  (tamsulosin) can double dose in future if prefer.   ---------  You are at increased risk of future Cardiovascular complications such as Heart Attack or Stroke from an artery blockage due to abnormal cholesterol and/or risk factors. - As discussed, Statin Cholesterol pills both can both LOWER cholesterol and REDUCE this future risk of heart attack and stroke - Start Rosuvastatin (generic Crestor) 10mg  pill once at bedtime every night  If you develop mild aches or pains in muscle or joint that does NOT improve or go away after first 3-4 weeks then this may require [place] to adjust the dose. First I would recommend STOPPING the medication for a few weeks until your ache and pain symptoms completely RESOLVE. Then you can restart at a LOWER DOSE either HALF a pill at bedtime every night or LESS OFTEN such as one pill a week only and then gradually increase to every other day or max dose of 3 times a week  Lastly, sometimes we need to try other versions of this medicine to find one that works for you and does not cause side effects.  ----------------------------------  Goal blood sugar targets Initial goal < 170 on average Eventually aim for < 150  Keep an eye on blood sugar on occasion  Keep on Metformin 500mg  twice a day  ------------------------------  Diet Recommendations for Diabetes   Starchy (carb) foods include: Bread, rice, pasta, potatoes, corn, crackers, bagels, muffins, all baked goods.   FRUITS - LIMIT these HIGH sugar/carb fruits = Pineapple, Watermelon, Bananas - OKAY with these MEDIUM sugar/carb fruits = Citrus, Oranges, Grapes - PREFER these LOW sugar/carb fruits = Apples, Berries, Pears, Plums  Protein foods include: Meat, fish, poultry, eggs, dairy foods, and beans such as pinto and kidney beans (beans also provide carbohydrate).    1. Eat at least 3 meals and 1-2 snacks per day. Never go more than 4-5 hours while awake without eating.   2. Limit starchy foods to TWO per meal and ONE per snack. ONE portion of a starchy  food is equal to the following:   - ONE slice of bread (or its equivalent, such as half of a hamburger bun).   - 1/2 cup of a "scoopable" starchy food such as potatoes or rice.   - 1 OUNCE (28 grams) of starchy snacks (crackers or pretzels, look on label).   - 15 grams of carbohydrate as shown on food label.   3. Both lunch and dinner should include a protein food, a carb food, and vegetables.   - Obtain twice as many veg's as protein or carbohydrate foods for both lunch and dinner.   - Try to keep frozen veg's on hand for a quick vegetable serving.     - Fresh or frozen veg's are best.   4. Breakfast should always include protein.      Please schedule a Follow-up Appointment to: Return in about 3 months (around 08/26/2023) for 3 month DM A1c, Arthritis, BPH Sleep.  If you have any other questions or concerns, please feel free to call the office or send a message through MyChart. You may also schedule an earlier appointment if necessary.  Additionally, you may be receiving a survey about your experience at our office within a few days to 1 week by e-mail or mail. We value your feedback.  Saralyn Pilar, DO [facility]

## 2023-05-27 MED ORDER — FREESTYLE LIBRE 3 PLUS SENSOR MISC: 1.0000 | 5 refills | Status: DC

## 2023-05-27 NOTE — Telephone Encounter (Signed)
RX sent to [facility],   -Vernona Rieger

## 2023-05-28 ENCOUNTER — Encounter: Payer: Self-pay | Admitting: Pharmacist

## 2023-05-28 ENCOUNTER — Ambulatory Visit: Payer: Commercial Managed Care - PPO | Admitting: Pharmacist

## 2023-05-28 ENCOUNTER — Other Ambulatory Visit: Payer: Self-pay

## 2023-05-28 DIAGNOSIS — E1169 Type 2 diabetes mellitus with other specified complication: Secondary | ICD-10-CM

## 2023-05-28 MED ORDER — RYBELSUS 3 MG PO TABS
ORAL_TABLET | ORAL | 0 refills | Status: DC
Start: 2023-05-28 — End: 2023-05-28
  Filled 2023-05-28: qty 30, 30d supply, fill #0

## 2023-05-28 MED ORDER — FREESTYLE LIBRE 3 SENSOR MISC
12 refills | Status: DC
Start: 2023-05-28 — End: 2023-05-28

## 2023-05-28 MED ORDER — RYBELSUS 3 MG PO TABS
ORAL_TABLET | ORAL | 0 refills | Status: DC
Start: 2023-05-28 — End: 2023-05-28

## 2023-05-28 NOTE — Progress Notes (Signed)
05/28/2023 Name: Chad Rose. MRN: [id number] DOB: 1961-09-12  Chief Complaint  Patient presents with   Medication Management   Medication Assistance    Chad Rose. is a 62 y.o. year old male who presented for a telephone visit.   They were referred to the pharmacist by their PCP for assistance in managing diabetes, hyperlipidemia, and medication access.    Subjective:  Care Team: Primary Care Provider: Smitty Cords, DO ; Next Scheduled Visit: 08/30/2023 Dietician: Altamese Cabal, RD; Initial Visit: 06/30/2023  Medication Access/Adherence  Current Pharmacy:  [facility] - [place], [place] - [address] [place] [address] Phone: [phone] Fax: [phone]   Patient reports affordability concerns with their medications: Yes  Patient reports access/transportation concerns to their pharmacy: No  Patient reports adherence concerns with their medications:  No     Diabetes:  Current medications: metformin 1000 mg - 1/2 tablet (500 mg) twice daily (started ~1 week ago) Report has had some diarrhea/constipation/queasy feeling with start, so has not yet increased dose, but symptoms seem to be improving and plans to increase as able  Medications tried in the past: Rybelsus (was unable to start in the past due to cost)  Denies monitoring home blood sugar recently as currently out of test strips  Patient interested in using Freestyle Libre 3 Plus continuous glucose monitoring and plans to pick this up from his pharmacy today  Reports has recently made changes to improve his diet, including: - Stopped soft drinks - Cut out bread, potatos and bread - Increasing intake of non-starchy vegetables (broccoli)  Statin therapy: rosuvastatin 10 mg daily (reports started taking as directed by PCP)  Current physical activity: walking for ~10 minutes every morning and evening  Current medication access support:  none   Objective:  Lab Results  Component Value Date   HGBA1C 9.8 (H) 05/19/2023    Lab Results  Component Value Date   CREATININE 1.24 05/19/2023   BUN 19 05/19/2023   NA 137 05/19/2023   K 4.4 05/19/2023   CL 101 05/19/2023   CO2 26 05/19/2023    Lab Results  Component Value Date   CHOL 261 (H) 05/19/2023   HDL 41 05/19/2023   LDLCALC 155 (H) 05/19/2023   TRIG 385 (H) 05/19/2023   CHOLHDL 6.4 (H) 05/19/2023    Medications Reviewed Today     Reviewed by Manuela Neptune, RPH-CPP (Pharmacist) on 05/28/23 at 1326  Med List Status: <None>   Medication Order Taking? Sig Documenting Provider Last Dose Status Informant  ascorbic acid (VITAMIN C) 500 MG tablet [id number]  Take 500 mg by mouth daily. [provider]  Active   Blood Glucose Monitoring Suppl (CONTOUR NEXT EZ) w/Device KIT [id number]  1 kit by Does not apply route 2 (two) times daily.  Patient not taking: Reported on 05/21/2023   Smitty Cords, DO  Active   cholecalciferol (VITAMIN D3) 25 MCG (1000 UNIT) tablet [id number]  Take 1,000 Units by mouth daily. [provider]  Active   Continuous Glucose Sensor (FREESTYLE LIBRE 3 PLUS SENSOR) MISC [id number]  1 each by Does not apply route every 14 (fourteen) days. Place 1 sensor on the skin every 14 days. Use to check glucose continuously DX: E11.9 Smitty Cords, DO  Active   glucose blood test strip [id number]  Use as instructed to check blood sugar up to 1-2 times daily  Patient not taking:  Reported on 05/21/2023   Smitty Cords, DO  Active   metFORMIN (GLUCOPHAGE) 1000 MG tablet [id number] Yes Take 0.5 tablets (500 mg total) by mouth 2 (two) times daily with a meal for 7 days, THEN 1 tablet (1,000 mg total) 2 (two) times daily with a meal. Lorre Munroe, NP Taking Active   rosuvastatin (CRESTOR) 10 MG tablet [id number] Yes Take 1 tablet (10 mg total) by mouth at bedtime. Smitty Cords, DO Taking Active    tamsulosin (FLOMAX) 0.4 MG CAPS capsule [id number] Yes Take 1 capsule (0.4 mg total) by mouth daily. Lorre Munroe, NP Taking Active               Assessment/Plan:   Diabetes: - Currently uncontrolled - Reviewed long term cardiovascular and renal outcomes of uncontrolled blood sugar - Reviewed dietary modifications including importance of having regular well-balanced meals throughout the day, while controlling carbohydrate portion sizes  Encourage patient to continue to positive dietary changes that he has made, including avoid consumption of sugary beverages, limiting carbohydrate portion sizes and increasing intake of non-starchy vegetables - Provide patient with counseling, link to guide/setup videos via MyChart and contact number for My[employer]estyle Program Patient to let pharmacy, office or Clinical Pharmacist know if has questions - Recommend to start using Freestyle Libre 3 CGM to monitor blood sugar/as feedback on dietary choices - Recommend to check glucose with fingerstick check when needed for symptoms and as back up to CGM. Patient to contact office if needed for readings outside of established parameters or symptoms - Send prescription for Rybelsus 3 mg daily to [facility] in order to determine patient's copayment though his commercial insurance. Collaborate with [facility] RPh Rinaldo Cloud who runs prescription through both patient's commercial plan and Rybelsus manufacturer coupon and advises cost is [phone] for 1 month supply  Follow up with patient to provide update Recommend patient follow up with his insurance plan for information regarding his prescription deductible, prescription formulary and summary of benefits If patient has high deductible plan, making GLP-1 RA medications currently unaffordable to him, will evaluate if patient eligible for patient assistance for an alternative such as Januvia or Jardiance from manufacturer assistance   Follow Up  Plan: Clinical Pharmacist will follow up with patient by telephone again within the next 7 business days  Estelle Grumbles, PharmD, [place], CPP Clinical Pharmacist [facility] [phone]

## 2023-05-28 NOTE — Patient Instructions (Addendum)
Goals Addressed             This Visit's Progress    Pharmacy Goals       The goal A1c is less than 7%. This is the best way to reduce the risk of the long term complications of diabetes, including heart disease, kidney disease, eye disease, strokes, and nerve damage. An A1c of less than 7% corresponds with fasting sugars less than 130 and 2 hour after meal sugars less than 180. Please start using the Freestyle Libre 3 Plus to monitor your blood sugar readings.  Please copy and paste the following web address into your web browser for the [employer] Videos:   [email or web address].[email or web address]/us-[email or web address]-[email or web address]-[email or web address]   Also, you can reach out to the MyFreeStyle Support at [phone]. Their customer support is available 7 days a week 8 am to 8 pm.   Our goal bad cholesterol, or LDL, is less than 70 . This is why it is important to continue taking your rosuvastatin.  Thank you!  Estelle Grumbles, PharmD, [place], CPP Clinical Pharmacist [facility] [phone]

## 2023-06-04 ENCOUNTER — Ambulatory Visit: Payer: Commercial Managed Care - PPO | Admitting: Pharmacist

## 2023-06-04 DIAGNOSIS — E1169 Type 2 diabetes mellitus with other specified complication: Secondary | ICD-10-CM

## 2023-06-04 NOTE — Progress Notes (Signed)
06/04/2023 Name: Chad Rose. MRN: [id number] DOB: Jun 18, 1961  Chief Complaint  Patient presents with   Medication Assistance   Medication Management    Chad Rose. is a 62 y.o. year old male who presented for a telephone visit.   They were referred to the pharmacist by their PCP for assistance in managing diabetes, hyperlipidemia, and medication access.      Subjective:   Care Team: Primary Care Provider: Smitty Cords, DO ; Next Scheduled Visit: 08/30/2023 Dietician: Altamese Cabal, RD; Initial Visit: 06/30/2023  Medication Access/Adherence  Current Pharmacy:  [facility] - [place], [place] - [address] [place] [address] Phone: [phone] Fax: [phone]  [facility] [address] [place] [address] Phone: [phone] Fax: [phone]   Patient reports affordability concerns with their medications: Yes  Patient reports access/transportation concerns to their pharmacy: No  Patient reports adherence concerns with their medications:  No       Diabetes:   Current medications: metformin 1000 mg - 1/2 tablet (500 mg) twice daily (started ~1 week ago) Report tried to increase dose to full tablet of metformin with a meal, but was unable to tolerate increase due to GI side effects   Medications tried in the past: Rybelsus (was unable to start in the past due to cost)   Confirms picked up and started using Freestyle Libre 3 Plus continuous glucose monitoring on 05/28/2023  Date of Download: 06/04/23 % Time CGM is active: 50% (note patient has not yet been using for 14-days) Average Glucose: 163 mg/dL Glucose Management Indicator: 7.2%  Glucose Variability: 16.4% (goal <36%) Time in Goal:  - Time in range 70-180: 77% - Time above range: 23% - Time below range: 0%  Reports using CGM for feedback on dietary choices   Reports maintaining changes to improve his diet,  including: - Stopped soft drinks - Cut out bread, potatos and bread - Increasing intake of non-starchy vegetables (broccoli)   Statin therapy: rosuvastatin 10 mg daily   Current physical activity: walking for ~10 minutes every morning and evening   Current medication access support: none   Objective:  Lab Results  Component Value Date   HGBA1C 9.8 (H) 05/19/2023    Lab Results  Component Value Date   CREATININE 1.24 05/19/2023   BUN 19 05/19/2023   NA 137 05/19/2023   K 4.4 05/19/2023   CL 101 05/19/2023   CO2 26 05/19/2023    Lab Results  Component Value Date   CHOL 261 (H) 05/19/2023   HDL 41 05/19/2023   LDLCALC 155 (H) 05/19/2023   TRIG 385 (H) 05/19/2023   CHOLHDL 6.4 (H) 05/19/2023    Medications Reviewed Today     Reviewed by Manuela Neptune, RPH-CPP (Pharmacist) on 06/04/23 at 1548  Med List Status: <None>   Medication Order Taking? Sig Documenting Provider Last Dose Status Informant  ascorbic acid (VITAMIN C) 500 MG tablet [id number]  Take 500 mg by mouth daily. [provider]  Active   Blood Glucose Monitoring Suppl (CONTOUR NEXT EZ) w/Device KIT [id number] No 1 kit by Does not apply route 2 (two) times daily.  Patient not taking: Reported on 05/21/2023   Smitty Cords, DO Not Taking Active   cholecalciferol (VITAMIN D3) 25 MCG (1000 UNIT) tablet [id number]  Take 1,000 Units by mouth daily. [provider]  Active   Continuous Glucose Sensor (  FREESTYLE LIBRE 3 PLUS SENSOR) MISC [id number]  Place 1 sensor on the skin every 14 days. Use to check glucose [provider]  Active   glucose blood test strip [id number] No Use as instructed to check blood sugar up to 1-2 times daily  Patient not taking: Reported on 05/21/2023   Smitty Cords, DO Not Taking Active   metFORMIN (GLUCOPHAGE) 1000 MG tablet [id number] No Take 0.5 tablets (500 mg total) by mouth 2 (two) times daily with a meal for 7 days, THEN 1 tablet  (1,000 mg total) 2 (two) times daily with a meal. Lorre Munroe, NP Taking Active            Med Note Ronney Asters, Johnross Nabozny A   Fri Jun 04, 2023  3:48 PM) Taking 1/2 tablet (500 mg) twice daily with meals  rosuvastatin (CRESTOR) 10 MG tablet [id number] No Take 1 tablet (10 mg total) by mouth at bedtime. Smitty Cords, DO Taking Active   tamsulosin (FLOMAX) 0.4 MG CAPS capsule [id number] No Take 1 capsule (0.4 mg total) by mouth daily. Lorre Munroe, NP Taking Active               Assessment/Plan:   Diabetes: - Currently uncontrolled - Reviewed long term cardiovascular and renal outcomes of uncontrolled blood sugar - Reviewed dietary modifications including importance of having regular well-balanced meals throughout the day, while controlling carbohydrate portion sizes  Encourage patient to continue positive dietary changes that he has made, including avoiding consumption of sugary beverages, limiting carbohydrate portion sizes and increasing intake of non-starchy vegetables Discuss balanced snack ideas to aid with blood sugar control - Recommend to continue using Freestyle Libre 3 CGM to monitor blood sugar/as feedback on dietary choices Patient to check glucose with fingerstick check when needed for symptoms and as back up to CGM. Patient to contact office if needed for readings outside of established parameters or symptoms - Recommend patient follow up with his insurance plan for information regarding his prescription deductible, prescription formulary (for potential preferred alternatives to Rybelsus) and summary of benefits information If patient has high deductible plan, making GLP-1 RA medications currently unaffordable to him, will evaluate if patient eligible for patient assistance for an alternative such as Januvia or Jardiance from manufacturer assistance     Follow Up Plan:   Patient to follow up with Clinical Pharmacist by telephone after getting in touch with his  insurance plan regarding prescription coverage information   Estelle Grumbles, PharmD, [place], CPP Clinical Pharmacist [facility] [phone]

## 2023-06-04 NOTE — Patient Instructions (Signed)
Goals Addressed             This Visit's Progress    Pharmacy Goals       The goal A1c is less than 7%. This is the best way to reduce the risk of the long term complications of diabetes, including heart disease, kidney disease, eye disease, strokes, and nerve damage. An A1c of less than 7% corresponds with fasting sugars less than 130 and 2 hour after meal sugars less than 180. Please start using the Freestyle Libre 3 Plus to monitor your blood sugar readings.   Our goal bad cholesterol, or LDL, is less than 70 . This is why it is important to continue taking your rosuvastatin.  Please follow up with your insurance company, as well as [facility] if needed, for further information regarding your coverage/cost for Rybelsus and cost of similar medications through your insurance plan  Thank you!  Estelle Grumbles, PharmD, [place], CPP Clinical Pharmacist [facility] [phone]

## 2023-06-30 ENCOUNTER — Encounter: Payer: Self-pay | Admitting: Dietician

## 2023-06-30 ENCOUNTER — Encounter: Payer: Commercial Managed Care - PPO | Attending: Family Medicine | Admitting: Dietician

## 2023-06-30 ENCOUNTER — Ambulatory Visit: Payer: Commercial Managed Care - PPO | Admitting: Pharmacist

## 2023-06-30 DIAGNOSIS — E1169 Type 2 diabetes mellitus with other specified complication: Secondary | ICD-10-CM

## 2023-06-30 DIAGNOSIS — E785 Hyperlipidemia, unspecified: Secondary | ICD-10-CM | POA: Diagnosis not present

## 2023-06-30 DIAGNOSIS — Z713 Dietary counseling and surveillance: Secondary | ICD-10-CM | POA: Diagnosis not present

## 2023-06-30 DIAGNOSIS — E1165 Type 2 diabetes mellitus with hyperglycemia: Secondary | ICD-10-CM | POA: Diagnosis not present

## 2023-06-30 NOTE — Patient Instructions (Addendum)
Take a daily MVI.  Begin taking your Flomax as prescribed again to aid in better sleep.  Continue your daily walks! Be sure to keep up a good pace and watch your CGM to see how it keeps your blood sugar lower.  Pay attention to the carbohydrates inn your food choices, moderate them as necessary based on how high your blood sugar goes after your meals.  Have a fat-free greek yogurt for an evening snack in place of fruit. Have that fruit first thing in the morning.  Check your blood sugar each morning before eating or drinking (fasting). Look for numbers under 130 mg/dL Check your blood sugar 2 hours after you begin eating a meal. Look for an elevation of 40 - 50 points MAX!  Your goal A1c is below 7.0%

## 2023-06-30 NOTE — Progress Notes (Unsigned)
06/30/2023 Name: Chad Rose. MRN: [id number] DOB: 04/03/1961  Chief Complaint  Patient presents with   Medication Management   Medication Assistance    Chad Rose. is a 62 y.o. year old male who presented for a telephone visit.   They were referred to the pharmacist by their PCP for assistance in managing diabetes, hyperlipidemia, and medication access.   Reach patient only briefly today as reports that he is not currently home.   Subjective:   Care Team: Primary Care Provider: Smitty Cords, DO ; Next Scheduled Visit: 08/30/2023 Dietician: Altamese Cabal, RD  Medication Access/Adherence  Current Pharmacy:  [facility] - [place], [place] - [address] [place] [address] Phone: [phone] Fax: [phone]  [facility] [address] [place] [address] Phone: [phone] Fax: [phone]   Patient reports affordability concerns with their medications: Yes  Patient reports access/transportation concerns to their pharmacy: No  Patient reports adherence concerns with their medications:  No      Diabetes:   Current medications: none  Medications tried in the past: Rybelsus (was unable to start in the past due to cost); metformin IR - reports unable to tolerate due to GI side effects   Reports was unable to tolerate metformin IR 1000 mg - 1/2 (500 mg) daily with meal due to stomach side effects. Expresses preference to work on improving blood sugar control with lifestyle changes before considering starting a new medication  Patient using Freestyle Libre 3 Plus continuous glucose monitoring   Date of Download: 06/30/23 % Time CGM is active: 85% Average Glucose: 183 mg/dL Glucose Management Indicator: 7.7%  Glucose Variability: 16.3% (goal <36%) Time in Goal:  - Time in range 70-180: 53% - Time above range: 47% - Time below range: 0%   Reports using CGM for  feedback on dietary choices  Note patient met with dietician at [facility] today and reports continuing to work on positive dietary changes    Current physical activity: walking for ~15-20 minutes every morning and evening   Current medication access support: none   Objective:  Lab Results  Component Value Date   HGBA1C 9.8 (H) 05/19/2023    Lab Results  Component Value Date   CREATININE 1.24 05/19/2023   BUN 19 05/19/2023   NA 137 05/19/2023   K 4.4 05/19/2023   CL 101 05/19/2023   CO2 26 05/19/2023     Outpatient Encounter Medications as of 06/30/2023  Medication Sig Note   ascorbic acid (VITAMIN C) 500 MG tablet Take 500 mg by mouth daily. (Patient not taking: Reported on 06/30/2023)    Blood Glucose Monitoring Suppl (CONTOUR NEXT EZ) w/Device KIT 1 kit by Does not apply route 2 (two) times daily. (Patient not taking: Reported on 05/21/2023)    cholecalciferol (VITAMIN D3) 25 MCG (1000 UNIT) tablet Take 1,000 Units by mouth daily. (Patient not taking: Reported on 06/30/2023)    Continuous Glucose Sensor (FREESTYLE LIBRE 3 PLUS SENSOR) MISC Place 1 sensor on the skin every 14 days. Use to check glucose    glucose blood test strip Use as instructed to check blood sugar up to 1-2 times daily (Patient not taking: Reported on 05/21/2023)    metFORMIN (GLUCOPHAGE) 1000 MG tablet Take 0.5 tablets (500 mg total) by mouth 2 (two) times daily with a meal for 7 days, THEN 1 tablet (1,000 mg total) 2 (two) times  daily with a meal. (Patient not taking: Reported on 06/30/2023) 06/04/2023: Taking 1/2 tablet (500 mg) twice daily with meals   rosuvastatin (CRESTOR) 10 MG tablet Take 1 tablet (10 mg total) by mouth at bedtime. (Patient not taking: Reported on 06/30/2023)    tamsulosin (FLOMAX) 0.4 MG CAPS capsule Take 1 capsule (0.4 mg total) by mouth daily. (Patient not taking: Reported on 06/30/2023)    No facility-administered encounter medications on file as of 06/30/2023.      Assessment/Plan:   Reach patient only briefly today as reports that he is not currently home.  Diabetes: - Currently uncontrolled - Have reviewed long term cardiovascular and renal outcomes of uncontrolled blood sugar - Have reviewed dietary modifications including importance of having regular well-balanced meals throughout the day, while controlling carbohydrate portion sizes  Encourage patient to continue positive dietary changes that he has made, including avoiding consumption of sugary beverages, limiting carbohydrate portion sizes and increasing intake of non-starchy vegetables - Recommend to continue using Freestyle Libre 3 CGM to monitor blood sugar/as feedback on dietary choices Patient to check glucose with fingerstick check when needed for symptoms and as back up to CGM. Patient to contact office if needed for readings outside of established parameters or symptoms - Patient preference to avoid staring medication at this time. States wants to see impact of dietary changes on his blood sugar control for the next couple of weeks, but agrees to consider medication management again if unable to control blood sugar with lifestyle changes alone If needed, may consider alternative such as Januvia from manufacturer assistance or extended release metformin, if patient agreeable     Follow Up Plan: Clinical Pharmacist will follow up with patient by telephone again in ~3 weeks   Estelle Grumbles, PharmD, [place], CPP Clinical Pharmacist [facility] [phone]

## 2023-06-30 NOTE — Progress Notes (Signed)
Diabetes Self-Management Education  Visit Type: First/Initial  Appt. Start Time: 0810 Appt. End Time: 0930  06/30/2023  Mr. Chad Rose, identified by name and date of birth, is a 62 y.o. male with a diagnosis of Diabetes: Type 2.   ASSESSMENT  There were no vitals taken for this visit. There is no height or weight on file to calculate BMI.  Pt reports getting a cortisone shot in March for torn rotator cuff, currently has severe tendonitis in L shoulder, A1c significantly elevated since then, usual A1c ~6-7%. Pt reports trying to eat better, walking twice a day for ~15-20 minutes. Pt reports starting their day ~8:00 am, doesn't eat breakfast until 10:00 am (usually high protein breakfast). Pt reports being a busy body, works a lot. Pt reports rising motorcycle to destress, rides every Wednesday night and every other weekend. Pt reports metformin caused significant diarrhea, stopped taking all their medications,  currently taking "Sugar Guardian" supplement for DM. Pt reports they were taking it after a meal on a full stomach. Pt report enlarged prostate that causes frequent nocturia, sleeps poorly/up a lot at night, stopped taking Tamsulosin when d/c metformin, states they have slept poorly for the last 20 years. Pt reports using FreeStyle Libre3 CGM. CGM Report: >250: 3% 181-250: 37% 70-180: 60% 54-69: 0% <54: 0% GMI - 7.3% (30 days)   Diabetes Self-Management Education - 06/30/23 0834       Visit Information   Visit Type First/Initial      Initial Visit   Diabetes Type Type 2    Date Diagnosed 2019    Are you currently following a meal plan? No    Are you taking your medications as prescribed? No      Health Coping   How would you rate your overall health? Good      Psychosocial Assessment   Patient Belief/Attitude about Diabetes Motivated to manage diabetes    What is the hardest part about your diabetes right now, causing you the most concern, or is the most  worrisome to you about your diabetes?   Making healty food and beverage choices    Self-care barriers None    Self-management support Doctor's office    Other persons present Patient    Patient Concerns Glycemic Control;Nutrition/Meal planning;Healthy Lifestyle    Special Needs None    Preferred Learning Style Other (comment)    Learning Readiness Ready    How often do you need to have someone help you when you read instructions, pamphlets, or other written materials from your doctor or pharmacy? 1 - Never    What is the last grade level you completed in school? 12th grade      Pre-Education Assessment   Patient understands the diabetes disease and treatment process. Needs Instruction    Patient understands incorporating nutritional management into lifestyle. Needs Instruction    Patient undertands incorporating physical activity into lifestyle. Needs Instruction    Patient understands using medications safely. Needs Instruction    Patient understands monitoring blood glucose, interpreting and using results Needs Instruction    Patient understands prevention, detection, and treatment of acute complications. Needs Instruction    Patient understands prevention, detection, and treatment of chronic complications. Needs Instruction    Patient understands how to develop strategies to address psychosocial issues. Needs Instruction    Patient understands how to develop strategies to promote health/change behavior. Needs Instruction      Complications   Last HgB A1C per patient/outside source 9.8 %  05/19/2023   How often do you check your blood sugar? > 4 times/day   CGM   Fasting Blood glucose range (mg/dL) 416-606    Postprandial Blood glucose range (mg/dL) 301-601;093-235;>573    Have you had a dilated eye exam in the past 12 months? Yes    Have you had a dental exam in the past 12 months? Yes    Are you checking your feet? Yes    How many days per week are you checking your feet? 1       Dietary Intake   Breakfast chicken, 2 eggs, coffe 2 creams, Splenda    Lunch Grilled chicken sub w/ lettuce tomato, mayo, pickles, baked chips, Mtn Dew ZERO    Dinner Shrimp, mixed vegetables, rice pilaf, water, [place] Ultra    Beverage(s) Mtn Dew ZERO, Propel, Water      Activity / Exercise   Activity / Exercise Type ADL's;Light (walking / raking leaves)    How many days per week do you exercise? 7   7   How many minutes per day do you exercise? 30    Total minutes per week of exercise 210      Patient Education   Previous Diabetes Education No    Disease Pathophysiology Factors that contribute to the development of diabetes;Explored patient's options for treatment of their diabetes    Healthy Eating Plate Method;Role of diet in the treatment of diabetes and the relationship between the three main macronutrients and blood glucose level    Being Active Role of exercise on diabetes management, blood pressure control and cardiac health.    Medications Reviewed patients medication for diabetes, action, purpose, timing of dose and side effects.    Monitoring Taught/evaluated CGM (comment)    Chronic complications Relationship between chronic complications and blood glucose control    Diabetes Stress and Support Role of stress on diabetes;Worked with patient to identify barriers to care and solutions   Lack of sleep!   Lifestyle and Health Coping Helped patient develop diabetes management plan for (enter comment);Lifestyle issues that need to be addressed for better diabetes care   Work/Life balance     Individualized Goals (developed by patient)   Nutrition Follow meal plan discussed;General guidelines for healthy choices and portions discussed    Physical Activity Exercise 5-7 days per week    Medications take my medication as prescribed   Flomax   Monitoring  Consistenly use CGM    Problem Solving Sleep Pattern;Eating Pattern    Reducing Risk examine blood glucose patterns    Health Coping  Ask for help with psychological, social, or emotional issues      Post-Education Assessment   Patient understands the diabetes disease and treatment process. Comprehends key points    Patient understands incorporating nutritional management into lifestyle. Comprehends key points    Patient undertands incorporating physical activity into lifestyle. Comprehends key points    Patient understands using medications safely. Needs Review    Patient understands monitoring blood glucose, interpreting and using results Needs Review    Patient understands prevention, detection, and treatment of acute complications. N/A    Patient understands prevention, detection, and treatment of chronic complications. Comprehends key points    Patient understands how to develop strategies to address psychosocial issues. Needs Review    Patient understands how to develop strategies to promote health/change behavior. Needs Review      Outcomes   Expected Outcomes Demonstrated interest in learning but significant barriers to change  Future DMSE 2 months    Program Status Not Completed             Individualized Plan for Diabetes Self-Management Training:   Learning Objective:  Patient will have a greater understanding of diabetes self-management. Patient education plan is to attend individual and/or group sessions per assessed needs and concerns.   Plan:   Patient Instructions  Take a daily MVI.  Begin taking your Flomax as prescribed again to aid in better sleep.  Continue your daily walks! Be sure to keep up a good pace and watch your CGM to see how it keeps your blood sugar lower.  Pay attention to the carbohydrates inn your food choices, moderate them as necessary based on how high your blood sugar goes after your meals.  Have a fat-free greek yogurt for an evening snack in place of fruit. Have that fruit first thing in the morning.  Check your blood sugar each morning before eating or drinking  (fasting). Look for numbers under 130 mg/dL Check your blood sugar 2 hours after you begin eating a meal. Look for an elevation of 40 - 50 points MAX!  Your goal A1c is below 7.0%     Expected Outcomes:  Demonstrated interest in learning but significant barriers to change  Education material provided: Carbohydrate counting sheet  If problems or questions, patient to contact team via:  Phone and Email  Future DSME appointment: 2 months

## 2023-07-01 NOTE — Patient Instructions (Signed)
Goals Addressed             This Visit's Progress    Pharmacy Goals       The goal A1c is less than 7%. This is the best way to reduce the risk of the long term complications of diabetes, including heart disease, kidney disease, eye disease, strokes, and nerve damage. An A1c of less than 7% corresponds with fasting sugars less than 130 and 2 hour after meal sugars less than 180. Please start using the Freestyle Libre 3 Plus to monitor your blood sugar readings.   Our goal bad cholesterol, or LDL, is less than 70 . This is why it is important to continue taking your rosuvastatin.  Thank you!  Estelle Grumbles, PharmD, Patsy [place], CPP Clinical Pharmacist [facility] [phone]

## 2023-07-23 ENCOUNTER — Ambulatory Visit: Payer: Commercial Managed Care - PPO | Admitting: Pharmacist

## 2023-07-23 DIAGNOSIS — E1169 Type 2 diabetes mellitus with other specified complication: Secondary | ICD-10-CM

## 2023-07-23 MED ORDER — METFORMIN HCL ER 500 MG PO TB24
500.0000 mg | ORAL_TABLET | Freq: Every day | ORAL | 0 refills | Status: DC
Start: 2023-07-23 — End: 2023-08-30

## 2023-07-23 NOTE — Progress Notes (Signed)
07/23/2023 Name: Chad Rose. MRN: [id number] DOB: 31-Mar-1961  Chief Complaint  Patient presents with   Medication Assistance   Medication Management    Chad Scaff. is a 62 y.o. year old male who presented for a telephone visit.   They were referred to the pharmacist by their PCP for assistance in managing diabetes, hyperlipidemia, and medication access.    Subjective:  Care Team: Primary Care Provider: Smitty Cords, DO ; Next Scheduled Visit: 08/30/2023 Dietician: Altamese Cabal, RD  Medication Access/Adherence  Current Pharmacy:  [facility] - [place], [place] - [address] [place] [address] Phone: [phone] Fax: [phone]  [facility] [address] [place] [address] Phone: [phone] Fax: [phone]   Patient reports affordability concerns with their medications: Yes  Patient reports access/transportation concerns to their pharmacy: No  Patient reports adherence concerns with their medications:  No       Diabetes:   Current medications: none   Medications tried in the past: Rybelsus (was unable to start in the past due to cost); metformin IR - reports unable to tolerate due to GI side effects  Patient using Freestyle Libre 3 Plus continuous glucose monitoring, but reports recently had a sensor error    Date of Download: 07/23/23 % Time CGM is active: 19% - Note patient recently had a sensor error and was not able to apply new sensor until 10/22 Average Glucose: 151 mg/dL Glucose Management Indicator: - Glucose Variability: 12.5% (goal <36%) Time in Goal:  - Time in range 70-180: 95% - Time above range: 5% - Time below range: 0%   Reports using CGM for feedback on dietary choices   Note patient met with dietician at [facility] and reports continuing positive dietary changes   Today patient interested in starting medication to aid with  blood sugar control   Current physical activity: walking for ~15-20 minutes every morning and evening   Statin therapy: rosuvastatin 10 mg daily  Current medication access support: none   Objective:  Lab Results  Component Value Date   HGBA1C 9.8 (H) 05/19/2023    Lab Results  Component Value Date   CREATININE 1.24 05/19/2023   BUN 19 05/19/2023   NA 137 05/19/2023   K 4.4 05/19/2023   CL 101 05/19/2023   CO2 26 05/19/2023    Lab Results  Component Value Date   CHOL 261 (H) 05/19/2023   HDL 41 05/19/2023   LDLCALC 155 (H) 05/19/2023   TRIG 385 (H) 05/19/2023   CHOLHDL 6.4 (H) 05/19/2023    Medications Reviewed Today     Reviewed by Chad Rose, RPH-CPP (Pharmacist) on 07/23/23 at 1201  Med List Status: <None>   Medication Order Taking? Sig Documenting Provider Last Dose Status Informant  ascorbic acid (VITAMIN C) 500 MG tablet [id number]  Take 500 mg by mouth daily.  Patient not taking: Reported on 06/30/2023   [provider]  Active   Blood Glucose Monitoring Suppl (CONTOUR NEXT EZ) w/Device KIT [id number]  1 kit by Does not apply route 2 (two) times daily.  Patient not taking: Reported on 05/21/2023   Smitty Cords, DO  Active   cholecalciferol (VITAMIN D3) 25 MCG (1000 UNIT) tablet [id number]  Take 1,000 Units by mouth daily.  Patient not taking: Reported on 06/30/2023   [provider]  Active   Continuous Glucose Sensor (FREESTYLE [place]  3 PLUS SENSOR) MISC [id number]  Place 1 sensor on the skin every 14 days. Use to check glucose [provider]  Active   glucose blood test strip [id number]  Use as instructed to check blood sugar up to 1-2 times daily  Patient not taking: Reported on 05/21/2023   Smitty Cords, DO  Active   rosuvastatin (CRESTOR) 10 MG tablet [id number]  Take 1 tablet (10 mg total) by mouth at bedtime.  Patient not taking: Reported on 06/30/2023   Smitty Cords, DO  Active    tamsulosin [facility]) 0.4 MG CAPS capsule [id number]  Take 1 capsule (0.4 mg total) by mouth daily.  Patient not taking: Reported on 06/30/2023   Lorre Munroe, NP  Active               Assessment/Plan:   [facility] today on behalf of patient to resolve issue with patient's tamsulosin prescription. Pharmacist [facility] confirms patient has #60 remaining on current prescription and will refill this for patient.  Diabetes: - Currently uncontrolled - Have reviewed long term cardiovascular and renal outcomes of uncontrolled blood sugar - Have reviewed dietary modifications including importance of having regular well-balanced meals throughout the day, while controlling carbohydrate portion sizes  Encourage patient to continue positive dietary changes that he has made, including avoiding consumption of sugary beverages, limiting carbohydrate portion sizes and increasing intake of non-starchy vegetables - Recommend to continue using Freestyle Libre 3 CGM to monitor blood sugar/as feedback on dietary choices Patient to check glucose with fingerstick check when needed for symptoms and as back up to CGM. Patient to contact office if needed for readings outside of established parameters or symptoms - Discuss medication options to aid with blood sugar control Note appears patient's current [employer] coverage has a high deductible- GLP-1 RA and SGLT2-inhibitor options currently unaffordable through his plan Based on reported income, patient does not meet criteria for patient assistance for Januvia through [employer] Patient was unable to tolerate metformin IR, but agreeable to trial metformin ER - Advise patient he may start metformin ER 500 mg daily with supper. Will send prescription to pharmacy for patient     Follow Up Plan: Clinical Pharmacist will follow up with patient by telephone on 08/20/2023 at 11:45 AM    Estelle Grumbles, PharmD, Patsy [place], CPP Clinical  Pharmacist [facility] [phone]

## 2023-07-23 NOTE — Patient Instructions (Signed)
Goals Addressed             This Visit's Progress    Pharmacy Goals       Sending prescription for metformin ER (Extended Release) 500 mg to pharmacy for you today. Please start taking 1 tablet daily with supper. Please take consistently and with meals. Metformin may cause side effects such as nausea, upset stomach or diarrhea. These usually improve over time. However, if symptoms do not improve or if you have any severe symptoms, please contact our office.  The goal A1c is less than 7%. This is the best way to reduce the risk of the long term complications of diabetes, including heart disease, kidney disease, eye disease, strokes, and nerve damage. An A1c of less than 7% corresponds with fasting sugars less than 130 and 2 hour after meal sugars less than 180. Please start using the Freestyle Libre 3 Plus to monitor your blood sugar readings.   Our goal bad cholesterol, or LDL, is less than 70 . This is why it is important to continue taking your rosuvastatin.  Thank you!  Estelle Grumbles, PharmD, Patsy [place], CPP Clinical Pharmacist [facility] [phone]

## 2023-08-20 ENCOUNTER — Other Ambulatory Visit: Payer: Commercial Managed Care - PPO

## 2023-08-20 ENCOUNTER — Telehealth: Payer: Self-pay | Admitting: Pharmacist

## 2023-08-20 NOTE — Telephone Encounter (Signed)
   Outreach Note  08/20/2023 Name: Chad Rose. MRN: [id number] DOB: [birth year]  Referred by: Smitty Cords, DO  Was unable to reach patient via telephone today and have left HIPAA compliant voicemail asking patient to return my call.   Follow Up Plan: Will collaborate with Care Guide to outreach to schedule follow up with me  Estelle Grumbles, PharmD, Patsy [place], CPP Clinical Pharmacist [facility] [phone]

## 2023-08-25 ENCOUNTER — Telehealth: Payer: Self-pay

## 2023-08-25 NOTE — Progress Notes (Signed)
  Care Coordination Note  08/25/2023 Name: [name]. MRN: [id number] DOB: 1960/09/30  Chad Rose. is a 62 y.o. year old male who is a primary care patient of Smitty Cords, DO and is actively engaged with the Chronic Care Management team. I reached out to Chad Rose. by phone today to assist with re-scheduling a follow up visit with the Pharmacist  Follow up plan: Unsuccessful telephone outreach attempt made. A HIPAA compliant phone message was left for the patient providing contact information and requesting a return call.   Penne Lash , RMA     [facility]  [facility], [facility] Guide  Direct Dial: [phone]  Website: [email or web address]

## 2023-08-30 ENCOUNTER — Encounter: Payer: Self-pay | Admitting: Family Medicine

## 2023-08-30 ENCOUNTER — Ambulatory Visit (INDEPENDENT_AMBULATORY_CARE_PROVIDER_SITE_OTHER): Payer: Commercial Managed Care - PPO | Admitting: Family Medicine

## 2023-08-30 VITALS — BP 118/62 | Resp 17 | Ht 69.0 in | Wt 177.8 lb

## 2023-08-30 DIAGNOSIS — M79641 Pain in right hand: Secondary | ICD-10-CM

## 2023-08-30 DIAGNOSIS — M19041 Primary osteoarthritis, right hand: Secondary | ICD-10-CM | POA: Diagnosis not present

## 2023-08-30 DIAGNOSIS — R351 Nocturia: Secondary | ICD-10-CM

## 2023-08-30 DIAGNOSIS — M19042 Primary osteoarthritis, left hand: Secondary | ICD-10-CM

## 2023-08-30 DIAGNOSIS — E1169 Type 2 diabetes mellitus with other specified complication: Secondary | ICD-10-CM

## 2023-08-30 DIAGNOSIS — M79642 Pain in left hand: Secondary | ICD-10-CM

## 2023-08-30 DIAGNOSIS — N401 Enlarged prostate with lower urinary tract symptoms: Secondary | ICD-10-CM

## 2023-08-30 LAB — POCT GLYCOSYLATED HEMOGLOBIN (HGB A1C): Hemoglobin A1C: 7.8 % — AB (ref 4.0–5.6)

## 2023-08-30 MED ORDER — TAMSULOSIN HCL 0.4 MG PO CAPS: 0.4000 mg | ORAL_CAPSULE | Freq: Every day | ORAL | 3 refills | Status: DC

## 2023-08-30 MED ORDER — METFORMIN HCL ER 500 MG PO TB24: 500.0000 mg | ORAL_TABLET | Freq: Every day | ORAL | 3 refills | Status: DC

## 2023-08-30 NOTE — Progress Notes (Unsigned)
Subjective:    Patient ID: Chad Rose., male    DOB: September 13, 1961, 62 y.o.   MRN: [id number]  Chad Rose. is a 62 y.o. male presenting on 08/30/2023 for Diabetes and Hypertension   HPI  Discussed the use of AI scribe software for clinical note transcription with the patient, who gave verbal consent to proceed.  History of Present Illness          ***A1c down to 7.8, he says his readings showed A1c 7.3 Unable to tolerate initial Metformin XR 500mg  daily   CHRONIC DM, Type 2: A1c 9.8, lately has not been controlled CBG rare check 170+ Meds: Metformin IR 500mg  twice a day (half of 1000mg  tabs) - Off Rybelsus, finished 3mg  months ago, did not get 7mg , and now high cost >$1000 Currently not on ACEi / ARB - last urine micro negative 11/2018 Lifestyle: - Diet (improving diabetic diet, low carb) - Exercise (active regular exercising) Diabetic Eye Exam done 02/16/23 Admits foot tingling Denies hypoglycemia, polyuria, visual changes, numbness or tingling.   HYPERLIPIDEMIA: Elevated LDL >150s, TG >350s Not on statin.   Subclinical Hypothyroidism Past trend 5+ years, now with still elevated TSH >6, but normal Free T4 Not on therapy   Additional concerns.   Arthritis hands, chronic problem ***Left Ring worse with swelling pain and contracture      BPH Insomnia, poor sleep Related to nocturia He is on Tamsulosin 0.4mg  daily, some improved urinary flow but not impacting his night time symptoms  ***CGM - Vitamin C?   Health Maintenance: ***     08/30/2023    4:25 PM 06/30/2023    8:26 AM 05/26/2023   10:52 AM  Depression screen PHQ 2/9  Decreased Interest 0 0 0  Down, Depressed, Hopeless 0 0 3  PHQ - 2 Score 0 0 3  Altered sleeping 0  3  Tired, decreased energy 0  3  Change in appetite 0  0  Feeling bad or failure about yourself  0  0  Trouble concentrating 0  0  Moving slowly or fidgety/restless 0  0  Suicidal thoughts 0  0  PHQ-9 Score 0  9   Difficult doing work/chores Not difficult at all         08/30/2023    4:26 PM 05/26/2023   10:52 AM 02/16/2023   11:44 AM  GAD 7 : Generalized Anxiety Score  Nervous, Anxious, on Edge 0 0 0  Control/stop worrying 0 0 0  Worry too much - different things 0 0 0  Trouble relaxing 0 0 0  Restless 0 0 0  Easily annoyed or irritable 0 0 0  Afraid - awful might happen 0 0 0  Total GAD 7 Score 0 0 0  Anxiety Difficulty Not difficult at all      Social History   Tobacco Use   Smoking status: Never   Smokeless tobacco: Never  Vaping Use   Vaping status: Never Used  Substance Use Topics   Alcohol use: Yes    Alcohol/week: 5.0 standard drinks of alcohol    Types: 5 Cans of beer per week   Drug use: No    Review of Systems Per HPI unless specifically indicated above     Objective:    BP 118/62 (BP Location: Left Arm, Patient Position: Sitting, Cuff Size: Normal)   Resp 17   Ht 5\' 9"  (1.753 m)   Wt 177 lb 12.8 oz (80.6 kg)   BMI  26.26 kg/m   Wt Readings from Last 3 Encounters:  08/30/23 177 lb 12.8 oz (80.6 kg)  05/26/23 175 lb (79.4 kg)  05/21/23 175 lb (79.4 kg)    Physical Exam  Results for orders placed or performed in visit on 08/30/23  POCT HgB A1C  Result Value Ref Range   Hemoglobin A1C 7.8 (A) 4.0 - 5.6 %   HbA1c POC (<> result, manual entry)     HbA1c, POC (prediabetic range)     HbA1c, POC (controlled diabetic range)        Assessment & Plan:   Problem List Items Addressed This Visit     Type 2 diabetes mellitus with other specified complication (HCC) - Primary   Relevant Orders   POCT HgB A1C (Completed)     Assessment and Plan              Orders Placed This Encounter  Procedures   POCT HgB A1C    No orders of the defined types were placed in this encounter.   Follow up plan: No follow-ups on file.  ***Future labs ordered for ***  A total of *** (lvl 3, 4, 5) 15, 25, 40 minutes was spent face-to-face with this patient. Greater  than 50% (approximately *** minutes) of this time was spent in counseling and coordination of care with the patient. ***  Saralyn Pilar, DO [facility] 08/30/2023, 4:36 PM

## 2023-08-30 NOTE — Patient Instructions (Addendum)
Thank you for coming to the office today.  X-ray hands, both hands. Any time during business hours Mon-Thurs 8am to 430pm  Recent Labs    02/16/23 1316 05/19/23 0757 08/30/23 1630  HGBA1C 10.2* 9.8* 7.8*   Keep on the same plan with Metformin XR and lifestyle changes  ----------  START anti inflammatory topical - OTC Voltaren (generic Diclofenac) topical 2-4 times a day as needed for pain swelling of affected joint for 1-2 weeks or longer.   Please schedule a Follow-up Appointment to: Return in about 4 months (around 12/29/2023) for 4 month DM A1c, Arthritis - earlier apt..  If you have any other questions or concerns, please feel free to call the office or send a message through MyChart. You may also schedule an earlier appointment if necessary.  Additionally, you may be receiving a survey about your experience at our office within a few days to 1 week by e-mail or mail. We value your feedback.  Saralyn Pilar, DO [facility], [place]

## 2023-08-31 DIAGNOSIS — M19041 Primary osteoarthritis, right hand: Secondary | ICD-10-CM | POA: Insufficient documentation

## 2023-08-31 NOTE — Assessment & Plan Note (Signed)
Improved A1c >9 down to 7.8 now on low dose Metformin XR and lifestyle modifications Side effects GI on Metformin IR on low dose High deductible plan unable to afford name brand meds No hypoglycemia Complications - hyperglycemia,  hyperlipidemia specifically hypertriglyceridemia, GERD, possible hypothyroidism, OSA - increases risk of future cardiovascular complications   Plan:  Continue Metformin XR 500mg  daily Continue CGM Proceed w/ Clinical Pharmacy for future cost saving options pending deductible / insurance changes in future Encourage improved lifestyle - low carb, low sugar diet, reduce portion size, continue improving regular exercise

## 2023-08-31 NOTE — Assessment & Plan Note (Signed)
Arthritis of the Hands Noted worsening pain and stiffness, particularly in the left ring finger. No imaging done to date. -Order X-rays of both hands. -Consider trying over-the-counter Voltaren gel up to 4 times daily for localized pain relief. -Consider further imaging or referral to a hand specialist if X-ray results are inconclusive or symptoms persist.

## 2023-08-31 NOTE — Progress Notes (Signed)
  Care Coordination Note  08/31/2023 Name: [name]. MRN: [id number] DOB: Jun 06, 1961  Chad Rose. is a 62 y.o. year old male who is a primary care patient of Smitty Cords, DO and is actively engaged with the Chronic Care Management team. I reached out to Chad Rose. by phone today to assist with re-scheduling a follow up visit with the Pharmacist  Follow up plan: Telephone appointment with care management team member scheduled for:09/13/2023  Penne Lash , RMA     [facility]  [facility], [facility] Guide  Direct Dial: [phone]  Website: [email or web address]

## 2023-08-31 NOTE — Assessment & Plan Note (Signed)
Improved chronic BPH LUTS nocturia on alpha blocker Tamsulosin PSA negative - Last DRE reported mild BPH or enlarged few year ago - No known personal/family history of prostate CA  Plan: 1. Continue Tamsulosin 0.4mg  daily, consider dose inc or other med Consider Urologist if needed

## 2023-09-08 ENCOUNTER — Ambulatory Visit
Admission: RE | Admit: 2023-09-08 | Discharge: 2023-09-08 | Disposition: A | Discharge status: Home / Self Care | Payer: Commercial Managed Care - PPO | Source: Ambulatory Visit | Attending: Family Medicine | Admitting: Family Medicine

## 2023-09-08 ENCOUNTER — Ambulatory Visit
Admission: RE | Admit: 2023-09-08 | Discharge: 2023-09-08 | Disposition: A | Discharge status: Home / Self Care | Payer: Commercial Managed Care - PPO | Attending: Family Medicine | Admitting: Family Medicine

## 2023-09-08 DIAGNOSIS — M79641 Pain in right hand: Secondary | ICD-10-CM | POA: Diagnosis present

## 2023-09-08 DIAGNOSIS — M79642 Pain in left hand: Secondary | ICD-10-CM | POA: Diagnosis present

## 2023-09-08 DIAGNOSIS — M19041 Primary osteoarthritis, right hand: Secondary | ICD-10-CM | POA: Diagnosis present

## 2023-09-08 DIAGNOSIS — M19042 Primary osteoarthritis, left hand: Secondary | ICD-10-CM | POA: Insufficient documentation

## 2023-09-13 ENCOUNTER — Other Ambulatory Visit: Payer: Commercial Managed Care - PPO | Admitting: Pharmacist

## 2023-09-13 NOTE — Progress Notes (Signed)
09/14/2023 Name: Chad Rose. MRN: [id number] DOB: 1961-02-07  Chief Complaint  Patient presents with   Medication Management   Medication Assistance    Chad Debaker. is a 62 y.o. year old male who presented for a telephone visit.   They were referred to the pharmacist by their PCP for assistance in managing diabetes, hyperlipidemia, and medication access.      Subjective:   Care Team: Primary Care Provider: Smitty Cords, DO ; Next Scheduled Visit: 12/29/2023 Dietician: Altamese Cabal, RD  Medication Access/Adherence  Current Pharmacy:  [facility] - [place], [place] - [address] [place] [address] Phone: [phone] Fax: [phone]  [facility] - [facility] [address] [place] [address] Phone: [phone] Fax: [phone]   Patient reports affordability concerns with their medications: Yes  Patient reports access/transportation concerns to their pharmacy: No  Patient reports adherence concerns with their medications:  No     Reports his biggest concern today is hand pain. Note patient seen for Office Visit with PCP on 08/30/2023 related to this concern and completed x-rays as ordered.  - Reports tried but did not notice improvement with OTC Voltaren gel. Per PCP note, patient previously tried Meloxicam 7.5mg  daily as needed with limited results  - Confirms will follow up with PCP regarding hand pain   Diabetes:   Current medications: metformin ER 500 mg daily   Medications tried in the past: Rybelsus (was unable to start in the past due to cost); metformin IR - reports unable to tolerate due to GI side effects   Patient previously using Freestyle Libre 3 Plus continuous glucose monitoring, but reports has been without sensors recently due to backorder. However, reports pharmacy was able to get this in stock and he is planning to restart CGM monitoring   Uses CGM for  feedback on dietary choices   Current physical activity: walking for ~15-20 minutes every morning and evening   Statin therapy: rosuvastatin 10 mg daily   Current medication access support: none   Objective:  Lab Results  Component Value Date   HGBA1C 7.8 (A) 08/30/2023    Lab Results  Component Value Date   CREATININE 1.24 05/19/2023   BUN 19 05/19/2023   NA 137 05/19/2023   K 4.4 05/19/2023   CL 101 05/19/2023   CO2 26 05/19/2023    Lab Results  Component Value Date   CHOL 261 (H) 05/19/2023   HDL 41 05/19/2023   LDLCALC 155 (H) 05/19/2023   TRIG 385 (H) 05/19/2023   CHOLHDL 6.4 (H) 05/19/2023    Medications Reviewed Today     Reviewed by Manuela Neptune, RPH-CPP (Pharmacist) on 09/14/23 at 1315  Med List Status: <None>   Medication Order Taking? Sig Documenting Provider Last Dose Status Informant  Continuous Glucose Sensor (FREESTYLE LIBRE 3 PLUS SENSOR) MISC [id number]  Place 1 sensor on the skin every 14 days. Use to check glucose [provider]  Active   metFORMIN (GLUCOPHAGE-XR) 500 MG 24 hr tablet [id number] Yes Take 1 tablet (500 mg total) by mouth daily with supper. Smitty Cords, DO Taking Active   rosuvastatin (CRESTOR) 10 MG tablet [id number] Yes Take 1 tablet (10 mg total) by mouth at bedtime. Smitty Cords, DO Taking Active   tamsulosin (FLOMAX) 0.4 MG CAPS capsule [id number] Yes Take 1 capsule (0.4 mg total) by mouth daily. Smitty Cords, DO Taking  Active               Assessment/Plan:   Patient plans to follow up with PCP regarding continued hand pain  Identify patient has run out of/in need of refill of rosuvastatin  States that he will follow up with pharmacy to pick up a refill  Diabetes: - Have reviewed dietary modifications including importance of having regular well-balanced meals throughout the day, while controlling carbohydrate portion sizes  Encouraged patient to continue positive  dietary changes that he has made, including avoiding consumption of sugary beverages, limiting carbohydrate portion sizes and increasing intake of non-starchy vegetables - Recommend to restart using Freestyle Libre 3 CGM to monitor blood sugar/as feedback on dietary choices Patient to check glucose with fingerstick check when needed for symptoms and as back up to CGM. Patient to contact office if needed for readings outside of established parameters or symptoms - Patient interested in discussing diabetes medication management options again in next calendar year when he may have new insurance coverage     Follow Up Plan: Clinical Pharmacist will follow up with patient by telephone on 10/25/2023 at 1:30 PM     Estelle Grumbles, PharmD, Patsy [place], CPP Clinical Pharmacist [facility] [phone]

## 2023-09-14 NOTE — Patient Instructions (Signed)
Goals Addressed             This Visit's Progress    Pharmacy Goals       The goal A1c is less than 7%. This is the best way to reduce the risk of the long term complications of diabetes, including heart disease, kidney disease, eye disease, strokes, and nerve damage. An A1c of less than 7% corresponds with fasting sugars less than 130 and 2 hour after meal sugars less than 180. Please start using the Freestyle Libre 3 Plus to monitor your blood sugar readings.   Our goal bad cholesterol, or LDL, is less than 70 . This is why it is important to continue taking your rosuvastatin.  Thank you!  Estelle Grumbles, PharmD, Patsy [place], CPP Clinical Pharmacist [facility] [phone]

## 2023-10-25 ENCOUNTER — Telehealth: Payer: Self-pay

## 2023-10-25 ENCOUNTER — Other Ambulatory Visit: Payer: Commercial Managed Care - PPO | Admitting: Pharmacist

## 2023-10-25 ENCOUNTER — Other Ambulatory Visit (HOSPITAL_COMMUNITY): Payer: Self-pay

## 2023-10-25 DIAGNOSIS — E1169 Type 2 diabetes mellitus with other specified complication: Secondary | ICD-10-CM

## 2023-10-25 MED ORDER — SAXAGLIPTIN HCL 5 MG PO TABS: 5.0000 mg | ORAL_TABLET | Freq: Every day | ORAL | 0 refills | Status: DC

## 2023-10-25 NOTE — Progress Notes (Signed)
10/25/2023 Name: Chad Rose. MRN: [id number] DOB: Dec 16, 1960  Chief Complaint  Patient presents with   Medication Management   Medication Assistance    Chad Rose. is a 63 y.o. year old male who presented for a telephone visit.   They were referred to the pharmacist by their PCP for assistance in managing diabetes, hyperlipidemia, and medication access.      Subjective:   Care Team: Primary Care Provider: Smitty Cords, DO ; Next Scheduled Visit: 12/29/2023 Dietician: Altamese Cabal, RD  Medication Access/Adherence  Current Pharmacy:  [facility] - [place], [place] - [address] [place] [address] Phone: [phone] Fax: [phone]  [facility] - [facility] [address] [place] [address] Phone: [phone] Fax: [phone]   Patient reports affordability concerns with their medications: Yes  Patient reports access/transportation concerns to their pharmacy: No  Patient reports adherence concerns with their medications:  No       Diabetes:   Current medications: metformin ER 500 mg daily   Medications tried in the past: Rybelsus (was unable to start in the past due to cost); metformin IR - reports unable to tolerate due to GI side effects   Patient previously using Freestyle Libre 3 Plus continuous glucose monitoring. Currently monitoring using glucometer, but plans to return to using CGM in the future  Reports recent blood sugar readings ranging 170-200   Current physical activity: Denies walking recently due to cold, but planning to restart walking   Statin therapy: rosuvastatin 10 mg daily   Current medication access support: none   Objective:  Lab Results  Component Value Date   HGBA1C 7.8 (A) 08/30/2023    Lab Results  Component Value Date   CREATININE 1.24 05/19/2023   BUN 19 05/19/2023   NA 137 05/19/2023   K 4.4 05/19/2023   CL 101 05/19/2023   CO2  26 05/19/2023    Lab Results  Component Value Date   CHOL 261 (H) 05/19/2023   HDL 41 05/19/2023   LDLCALC 155 (H) 05/19/2023   TRIG 385 (H) 05/19/2023   CHOLHDL 6.4 (H) 05/19/2023    Medications Reviewed Today     Reviewed by Manuela Neptune, RPH-CPP (Pharmacist) on 10/25/23 at 1348  Med List Status: <None>   Medication Order Taking? Sig Documenting Provider Last Dose Status Informant  Continuous Glucose Sensor (FREESTYLE LIBRE 3 PLUS SENSOR) MISC [id number]  Place 1 sensor on the skin every 14 days. Use to check glucose [provider]  Active   metFORMIN (GLUCOPHAGE-XR) 500 MG 24 hr tablet [id number] Yes Take 1 tablet (500 mg total) by mouth daily with supper. Smitty Cords, DO Taking Active   rosuvastatin (CRESTOR) 10 MG tablet [id number]  Take 1 tablet (10 mg total) by mouth at bedtime. Smitty Cords, DO  Active   tamsulosin (FLOMAX) 0.4 MG CAPS capsule [id number]  Take 1 capsule (0.4 mg total) by mouth daily. Smitty Cords, DO  Active               Assessment/Plan:   Encourage patient to follow up with PCP regarding continued hand pain     Diabetes: - Have reviewed dietary modifications including importance of having regular well-balanced meals throughout the day, while controlling carbohydrate portion sizes  Encouraged patient to continue positive dietary changes that he has made, including avoiding consumption of sugary beverages, limiting carbohydrate portion sizes and  increasing intake of non-starchy vegetables - Recommend to restart using Freestyle Libre 3 CGM to monitor blood sugar/as feedback on dietary choices Patient to check glucose with fingerstick check when needed for symptoms and as back up to CGM. Patient to contact office if needed for readings outside of established parameters or symptoms - Discuss medication options to aid with blood sugar control Denies interest in increasing metformin dose due to history of  side effects when on metformin IR. Requests to add an alternative agent instead. Note appears patient's current [employer] coverage has a high deductible- GLP-1 RA and SGLT2-inhibitor options currently unaffordable through his plan Based on reported income, patient does not meet criteria for patient assistance for Januvia through [employer] with CPhT Monica Becton for assistance with evaluating cost for medication therapy options. Find that generic Onglyza (saxagliptin) is available through patient's plan for $40.04 for 30 day supply  Follow up with patient to provide update. Patient is interested in starting saxagliptin to aid with blood sugar control Counseled on saxagliptin, including mechanism of action, side effects, and benefits. Patient verbalized understanding. CPP collaborates with PCP and sends prescription for saxagliptin 5 mg daily to pharmacy for patient     Follow Up Plan: Clinical Pharmacist will follow up with patient by telephone next month   Estelle Grumbles, PharmD, Patsy [place], CPP Clinical Pharmacist [facility] [phone]

## 2023-10-25 NOTE — Telephone Encounter (Signed)
Pharmacy Patient Advocate Encounter  Insurance verification completed.    The patient is insured through  [facility] . Patient has [employer], may use a copay card, and/or apply for patient assistance if available.    Ran test claim for Ozempic and the current 30 day co-pay is $904.21.  Ran test claim for Trulicity and the current 30 day co-pay is $894.39.  Ran test claim for Bydureon B-cise and the current 30 day co-pay is $743.34.  Ran test claim for Victoza and the current 30 day co-pay is $640.59.  Ran test claim for Rybelsus and the current 30 day co-pay is $904.21.  Ran test claim for Invokana and the current 30 day co-pay is $526.90.  Ran test claim for Generic Marcelline Deist and the current 30 day co-pay is $438.94.  Ran test claim for Marcelline Deist (brand) and the current 30 day co-pay is $527.99.  Ran test claim for Jardiance and the current 30 day co-pay is $556.09.  Ran test claim for Januvia and the current 30 day co-pay is $272.95.  Ran test claim for Zituvio and the current 30 day co-pay is $424.16.  Ran test claim for Tradjenta and the current 30 day co-pay is $457.42  Ran test claim for Generic Onglyza and the current 30 day co-pay is $40.04  This test claim was processed through [employer]- copay amounts may vary at other pharmacies due to [employer], or as the patient moves through the different stages of their insurance plan.

## 2023-10-25 NOTE — Patient Instructions (Signed)
Goals Addressed             This Visit's Progress    Pharmacy Goals       The goal A1c is less than 7%. This is the best way to reduce the risk of the long term complications of diabetes, including heart disease, kidney disease, eye disease, strokes, and nerve damage. An A1c of less than 7% corresponds with fasting sugars less than 130 and 2 hour after meal sugars less than 180. Please start using the Freestyle Libre 3 Plus to monitor your blood sugar readings.   Our goal bad cholesterol, or LDL, is less than 70 . This is why it is important to continue taking your rosuvastatin.  Thank you!  Estelle Grumbles, PharmD, Patsy [place], CPP Clinical Pharmacist [facility] [phone]

## 2023-11-19 ENCOUNTER — Other Ambulatory Visit: Payer: Commercial Managed Care - PPO | Admitting: Pharmacist

## 2023-11-19 DIAGNOSIS — E119 Type 2 diabetes mellitus without complications: Secondary | ICD-10-CM

## 2023-11-19 DIAGNOSIS — E1169 Type 2 diabetes mellitus with other specified complication: Secondary | ICD-10-CM

## 2023-11-19 DIAGNOSIS — Z7984 Long term (current) use of oral hypoglycemic drugs: Secondary | ICD-10-CM

## 2023-11-19 NOTE — Progress Notes (Signed)
11/19/2023 Name: Chad Rose. MRN: [id number] DOB: 02-26-1961  Chief Complaint  Patient presents with   Medication Management   Medication Assistance    Chad Rose. is a 63 y.o. year old male who presented for a telephone visit.   They were referred to the pharmacist by their PCP for assistance in managing diabetes, hyperlipidemia, and medication access.      Subjective:   Care Team: Primary Care Provider: Smitty Cords, DO ; Next Scheduled Visit: 12/29/2023 Dietician: Altamese Cabal, RD  Medication Access/Adherence  Current Pharmacy:  [facility] - [place], [place] - [address] [place] [address] Phone: [phone] Fax: [phone]  [facility] - [facility] [address] [place] [address] Phone: [phone] Fax: [phone]   Patient reports affordability concerns with their medications: Yes  Patient reports access/transportation concerns to their pharmacy: No  Patient reports adherence concerns with their medications:  No    Diabetes:   Current medications: metformin ER 500 mg daily   Medications tried in the past: Rybelsus (was unable to start in the past due to cost); metformin IR - reports unable to tolerate due to GI side effects  Today patient shares that he was unable to start saxagliptin as planned as [facility] was unable to get this in stock for him   Reports restarted using Freestyle Libre 3 Plus continuous glucose monitoring on 2/15. Review results from [employer] today:   Date of Download: 11/19/23 % Time CGM is active: 44% Average Glucose: 162 mg/dL Glucose Management Indicator: 7.2%  Glucose Variability: 19.4% (goal <36%) Time in Goal:  - Time in range 70-180: 75% - Time above range: 25% - Time below range: 0%    Current physical activity: Denies walking recently due to cold, but planning to restart walking   Statin therapy: rosuvastatin 10 mg  daily   Current medication access support: none   Objective:  Lab Results  Component Value Date   HGBA1C 7.8 (A) 08/30/2023    Lab Results  Component Value Date   CREATININE 1.24 05/19/2023   BUN 19 05/19/2023   NA 137 05/19/2023   K 4.4 05/19/2023   CL 101 05/19/2023   CO2 26 05/19/2023    Lab Results  Component Value Date   CHOL 261 (H) 05/19/2023   HDL 41 05/19/2023   LDLCALC 155 (H) 05/19/2023   TRIG 385 (H) 05/19/2023   CHOLHDL 6.4 (H) 05/19/2023    Medications Reviewed Today     Reviewed by Chad Rose, RPH-CPP (Pharmacist) on 11/19/23 at 1445  Med List Status: <None>   Medication Order Taking? Sig Documenting Provider Last Dose Status Informant  Continuous Glucose Sensor (FREESTYLE LIBRE 3 PLUS SENSOR) MISC [id number]  Place 1 sensor on the skin every 14 days. Use to check glucose [provider]  Active   metFORMIN (GLUCOPHAGE-XR) 500 MG 24 hr tablet [id number] Yes Take 1 tablet (500 mg total) by mouth daily with supper. Smitty Cords, DO Taking Active   rosuvastatin (CRESTOR) 10 MG tablet [id number]  Take 1 tablet (10 mg total) by mouth at bedtime. Smitty Cords, DO  Active   tamsulosin (FLOMAX) 0.4 MG CAPS capsule [id number]  Take 1 capsule (0.4 mg total) by mouth daily. Smitty Cords, DO  Active               Assessment/Plan:   Counsel on importance of  adherence to statin therapy for ASCVD risk reduction  Diabetes: - Have reviewed dietary modifications including importance of having regular well-balanced meals throughout the day, while controlling carbohydrate portion sizes  Encouraged patient to continue positive dietary changes that he has made, including avoiding consumption of sugary beverages, limiting carbohydrate portion sizes and increasing intake of non-starchy vegetables - Recommend to use Freestyle Libre 3 CGM to monitor blood sugar/as feedback on dietary choices Patient to check glucose with  fingerstick check when needed for symptoms and as back up to CGM. Patient to contact office if needed for readings outside of established parameters or symptoms - Rather than attempting to find saxagliptin from alternative pharmacy, agree to hold off on starting this medication given recent blood sugar control       Follow Up Plan: Clinical Pharmacist will follow up with patient by telephone on 01/31/2024 at 9:00 AM    Chad Rose, PharmD, Chad Rose, CPP Clinical Pharmacist [facility] [phone]

## 2023-11-19 NOTE — Patient Instructions (Signed)
 Goals Addressed             This Visit's Progress    Pharmacy Goals       The goal A1c is less than 7%. This is the best way to reduce the risk of the long term complications of diabetes, including heart disease, kidney disease, eye disease, strokes, and nerve damage. An A1c of less than 7% corresponds with fasting sugars less than 130 and 2 hour after meal sugars less than 180. Please start using the Freestyle Libre 3 Plus to monitor your blood sugar readings.   Our goal bad cholesterol, or LDL, is less than 70 . This is why it is important to continue taking your rosuvastatin.  Thank you!  Estelle Grumbles, PharmD, Patsy [place], CPP Clinical Pharmacist [facility] [phone]

## 2023-12-29 ENCOUNTER — Ambulatory Visit: Payer: Commercial Managed Care - PPO | Admitting: Family Medicine

## 2024-01-31 ENCOUNTER — Other Ambulatory Visit: Payer: Commercial Managed Care - PPO | Admitting: Pharmacist

## 2024-01-31 DIAGNOSIS — E119 Type 2 diabetes mellitus without complications: Secondary | ICD-10-CM

## 2024-01-31 DIAGNOSIS — E1169 Type 2 diabetes mellitus with other specified complication: Secondary | ICD-10-CM

## 2024-01-31 NOTE — Progress Notes (Signed)
 01/31/2024 Name: Chad Rose. MRN: [id number] DOB: 1961-01-28  Chief Complaint  Patient presents with   Medication Management    Chad Rose. is a 63 y.o. year old male who presented for a telephone visit.   They were referred to the pharmacist by their PCP for assistance in managing diabetes, hyperlipidemia, and medication access.      Subjective:   Care Team: Primary Care Provider: Raina Bunting, DO Dietician: Chad Rose, RD  Medication Access/Adherence  Current Pharmacy:  [facility] - [place], [place] - [address] [place] [address] Phone: [phone] Fax: [phone]  [facility] - [facility] [address] [place] [address] Phone: [phone] Fax: [phone]   Patient reports affordability concerns with their medications: Yes  Patient reports access/transportation concerns to their pharmacy: No  Patient reports adherence concerns with their medications:  No     Diabetes:   Current medications: metformin  ER 500 mg daily   Medications tried in the past: Rybelsus  (was unable to start in the past due to cost); metformin  IR - reports unable to tolerate due to GI side effects   Reports just restarted using Freestyle Libre 3 Plus continuous glucose monitoring (Previously off because his pharmacy had been unable to get in stock) Recent morning fasting readings ranging 125-162   Reports planning to start new insurance in ~1 month and requests to review medication options again at that time   Current physical activity: Reports has restarted walking at least 1 mile daily   Statin therapy: rosuvastatin  10 mg daily   Current medication access support: none   Objective:  Lab Results  Component Value Date   HGBA1C 7.8 (A) 08/30/2023    Lab Results  Component Value Date   CREATININE 1.24 05/19/2023   BUN 19 05/19/2023   NA 137 05/19/2023   K 4.4 05/19/2023   CL 101  05/19/2023   CO2 26 05/19/2023    Lab Results  Component Value Date   CHOL 261 (H) 05/19/2023   HDL 41 05/19/2023   LDLCALC 155 (H) 05/19/2023   TRIG 385 (H) 05/19/2023   CHOLHDL 6.4 (H) 05/19/2023    Medications Reviewed Today     Reviewed by Ardis Becton, RPH-CPP (Pharmacist) on 01/31/24 at 0920  Med List Status: <None>   Medication Order Taking? Sig Documenting Provider Last Dose Status Informant  Continuous Glucose Sensor (FREESTYLE LIBRE 3 PLUS SENSOR) MISC [id number]  Place 1 sensor on the skin every 15 days. Use to check glucose [provider]  Active   metFORMIN  (GLUCOPHAGE -XR) 500 MG 24 hr tablet [id number] Yes Take 1 tablet (500 mg total) by mouth daily with supper. Chad Bunting, DO Taking Active   rosuvastatin  (CRESTOR ) 10 MG tablet [id number]  Take 1 tablet (10 mg total) by mouth at bedtime. Chad Bunting, DO  Active   tamsulosin  (FLOMAX ) 0.4 MG CAPS capsule [id number]  Take 1 capsule (0.4 mg total) by mouth daily. Chad Bunting, DO  Active               Assessment/Plan:   Advise patient to contact office to reschedule missed appointment with PCP  Advise patient to contact alternative pharmacies, including [facility] if needed if unable to obtain Freestyle Libre 3 Plus sensors from [facility]  Diabetes: - Have reviewed dietary modifications including importance of having regular well-balanced meals throughout the day,  while controlling carbohydrate portion sizes  Encouraged patient to continue positive dietary changes that he has made, including avoiding consumption of sugary beverages, limiting carbohydrate portion sizes and increasing intake of non-starchy vegetables - Recommend to use Freestyle Libre 3 CGM to monitor blood sugar/as feedback on dietary choices Patient to check glucose with fingerstick check when needed for symptoms and as back up to CGM. Patient to contact office if needed for  readings outside of established parameters or symptoms - Rather than adjust medications today, patient requests to follow up again in 1 month to evaluate medication options as anticipates having a new insurance plan at that time       Follow Up Plan: Clinical Pharmacist will follow up with patient by telephone on 02/28/2024 at 1:30 PM    Arthur Lash, PharmD, Becky Bowels, CPP Clinical Pharmacist [facility] [phone]

## 2024-01-31 NOTE — Patient Instructions (Signed)
 Goals Addressed             This Visit's Progress    Pharmacy Goals       The goal A1c is less than 7%. This is the best way to reduce the risk of the long term complications of diabetes, including heart disease, kidney disease, eye disease, strokes, and nerve damage. An A1c of less than 7% corresponds with fasting sugars less than 130 and 2 hour after meal sugars less than 180. Please start using the Freestyle Libre 3 Plus to monitor your blood sugar readings.   Our goal bad cholesterol, or LDL, is less than 70 . This is why it is important to continue taking your rosuvastatin.  Thank you!  Estelle Grumbles, PharmD, Patsy [place], CPP Clinical Pharmacist [facility] [phone]

## 2024-02-28 ENCOUNTER — Encounter: Payer: Self-pay | Admitting: Pharmacist

## 2024-02-28 ENCOUNTER — Other Ambulatory Visit (INDEPENDENT_AMBULATORY_CARE_PROVIDER_SITE_OTHER): Admitting: Pharmacist

## 2024-02-28 DIAGNOSIS — Z7984 Long term (current) use of oral hypoglycemic drugs: Secondary | ICD-10-CM

## 2024-02-28 DIAGNOSIS — E1169 Type 2 diabetes mellitus with other specified complication: Secondary | ICD-10-CM

## 2024-02-28 DIAGNOSIS — E119 Type 2 diabetes mellitus without complications: Secondary | ICD-10-CM

## 2024-02-28 NOTE — Progress Notes (Unsigned)
   02/28/2024 Name: Chad Rose. MRN: [id number] DOB: 1961/09/24  Chief Complaint  Patient presents with   Medication Management   Medication Assistance    Chad Rose. is a 63 y.o. year old male who presented for a telephone visit.   They were referred to the pharmacist by their PCP for assistance in managing diabetes, hyperlipidemia, and medication access.      Subjective:   Care Team: Primary Care Provider: Raina Bunting, DO Dietician: Lovina Ruddle, RD  Medication Access/Adherence  Current Pharmacy:  [facility] - [place], [place] - [address] [place] [address] Phone: [phone] Fax: [phone]  [facility] [address] [place] [address] Phone: [phone] Fax: [phone]   Patient reports affordability concerns with their medications: Yes  Patient reports access/transportation concerns to their pharmacy: No  Patient reports adherence concerns with their medications:  No    Reports recently started new insurance with [employer] on Friday, but unable to provide information from card during our call.   Diabetes:   Current medications: metformin  ER 500 mg daily   Medications tried in the past: Rybelsus  (was unable to start in the past due to cost); metformin  IR - reports unable to tolerate due to GI side effects   Reports just restarted using Freestyle Libre 3 Plus continuous glucose monitoring. Recent morning fasting readings ranging 123-181    Current physical activity: Reports has walking at least 1 mile daily   Statin therapy: rosuvastatin  10 mg daily   Current medication access support: none   Objective:  Lab Results  Component Value Date   HGBA1C 7.8 (A) 08/30/2023    Lab Results  Component Value Date   CREATININE 1.24 05/19/2023   BUN 19 05/19/2023   NA 137 05/19/2023   K 4.4 05/19/2023   CL 101 05/19/2023   CO2 26 05/19/2023    Lab  Results  Component Value Date   CHOL 261 (H) 05/19/2023   HDL 41 05/19/2023   LDLCALC 155 (H) 05/19/2023   TRIG 385 (H) 05/19/2023   CHOLHDL 6.4 (H) 05/19/2023    Medications Reviewed Today   Medications were not reviewed in this encounter       Assessment/Plan:   Reports recently started new insurance with [employer] on Friday, but unable to provide information from card during our call. As requested, send patient a MyChart message and patient plans to reply with images of his new insurance card Will plan to collaborate with CPhT team to investigate covered therapy options for patient once received  Diabetes: - Have reviewed dietary modifications including importance of having regular well-balanced meals throughout the day, while controlling carbohydrate portion sizes  Encouraged patient to continue positive dietary changes that he has made, including avoiding consumption of sugary beverages, limiting carbohydrate portion sizes and increasing intake of non-starchy vegetables - Recommend to use Freestyle Libre 3 CGM to monitor blood sugar/as feedback on dietary choices Patient to check glucose with fingerstick check when needed for symptoms and as back up to CGM. Patient to contact office if needed for readings outside of established parameters or symptoms       Follow Up Plan: Clinical Pharmacist will follow up with patient by telephone on ***    Arthur Lash, PharmD, Becky Bowels, CPP Clinical Pharmacist [facility] [phone]

## 2024-03-01 ENCOUNTER — Telehealth: Payer: Self-pay

## 2024-03-01 ENCOUNTER — Encounter: Payer: Self-pay | Admitting: Pharmacist

## 2024-03-01 ENCOUNTER — Other Ambulatory Visit: Payer: Self-pay | Admitting: Pharmacist

## 2024-03-01 ENCOUNTER — Other Ambulatory Visit (HOSPITAL_COMMUNITY): Payer: Self-pay

## 2024-03-01 DIAGNOSIS — E1169 Type 2 diabetes mellitus with other specified complication: Secondary | ICD-10-CM

## 2024-03-01 DIAGNOSIS — E119 Type 2 diabetes mellitus without complications: Secondary | ICD-10-CM

## 2024-03-01 MED ORDER — RYBELSUS 3 MG PO TABS: ORAL_TABLET | ORAL | 0 refills | Status: DC

## 2024-03-01 NOTE — Telephone Encounter (Signed)
-----   Message from Ardis Becton sent at 03/01/2024  8:52 AM EDT ----- Regarding: PA for Rybelsus  Good Morning!   I am trying to determine medication cost for this patient since he obtained new insurance through [employer].   Haskell Linker did an eligibility check for this new coverage ##Note patient no longer has the [facility] coverage that is showing up as active.  I tried to submit a PA for Rybelsus  via covermymeds, but received an error: Member Not Found  Please Teams me if you want a screenshot of the coverage info Haskell Linker found (can't paste here)  Payer: [employer] BIN: [id number] Group number [id number] Member ID: [id number]   Thank you!  Timoteo Force

## 2024-03-01 NOTE — Telephone Encounter (Signed)
 Pharmacy Patient Advocate Encounter   Received notification from Physician's Office that prior authorization for Rybelsus  3MG  tablets is required/requested.   Insurance verification completed.   The patient is insured through [facility] .   Per test claim: PA required; PA submitted to above mentioned insurance via CoverMyMeds Key/confirmation #/EOC [id number] Status is pending

## 2024-03-01 NOTE — Patient Instructions (Signed)
 Plan is to start Rybelsus  3 mg daily. Take on an empty stomach with up to 4 ounces of plain water  only, at least 30 minutes before eating food, drinking beverages, or taking other oral medications. This medication may cause stomach upset, queasiness, or constipation, especially when first starting. This generally improves over time. Call our office if these symptoms occur and worsen, or if you have severe symptoms such as vomiting, diarrhea, or stomach pain.    The following is the web address for the manufacturer savings card for Rybelsus :   https://www.rybelsus .com/savings-and-support.html     I look forward to speaking with you again by telephone on 04/03/2024 at 11:30 AM.   Please feel free to give me a call if you have medication-related questions sooner!   Arthur Lash, PharmD, Becky Bowels, CPP Clinical Pharmacist [facility] [phone]

## 2024-03-01 NOTE — Patient Instructions (Signed)
 Goals Addressed             This Visit's Progress    Pharmacy Goals       The goal A1c is less than 7%. This is the best way to reduce the risk of the long term complications of diabetes, including heart disease, kidney disease, eye disease, strokes, and nerve damage. An A1c of less than 7% corresponds with fasting sugars less than 130 and 2 hour after meal sugars less than 180. Please start using the Freestyle Libre 3 Plus to monitor your blood sugar readings.   Our goal bad cholesterol, or LDL, is less than 70 . This is why it is important to continue taking your rosuvastatin.  Thank you!  Estelle Grumbles, PharmD, Patsy [place], CPP Clinical Pharmacist [facility] [phone]

## 2024-03-01 NOTE — Progress Notes (Signed)
   03/01/2024 Name: Chad Rose. MRN: [id number] DOB: 09-May-1961  Chief Complaint  Patient presents with   Medication Management   Medication Assistance    Chad Rose. is a 63 y.o. year old male who was referred to the pharmacist by their PCP for assistance in managing diabetes, hyperlipidemia, and medication access.   Today collaborate with CPhT team to check for active [employer] for patient as he started new insurance with [employer] on Friday and requests help with determining if affordable for him to start on a GLP-1 RA or SGLT2-inhibitor therapy at this time.   Coverage found. Note patient previously unable to start Rybelsus  as prescribed by PCP due to cost. CPhT submits prior authorization request for Rybelsus  for patient and advises that PA is approved through 03/01/2025. Per test claim, appears patient's copayment for Rybelsus  through insurance is $60. - Follow up with patient to provide update. Patient interested in starting Rybelsus  for blood sugar control  Counseled on Rybelsus , including mechanism of action, side effects, and benefits. No personal or family history of medullary thyroid  cancer, personal history of pancreatitis or gallbladder disease. Counseled on potential side effects of nausea, stomach upset, queasiness, constipation, and that these generally improve over time. Advised to contact our office with more severe symptoms, including nausea, diarrhea, stomach pain. Patient verbalized understanding.  Advise patient that he may start Rybelsus  3 mg daily. Counsel to take on an empty stomach with up to 4 ounces of plain water  only, >=30 minutes before eating food, drinking beverages, or taking other oral medications - CPP sends prescription to pharmacy for patient  Counsel patient on Rybelsus  savings card from manufacturer to further reduce cost   Follow Up Plan: Clinical Pharmacist will follow up with patient by telephone on 04/03/2024 at 11:30 AM   Arthur Lash, PharmD, Becky Bowels, CPP Clinical Pharmacist [facility] [phone]

## 2024-03-01 NOTE — Telephone Encounter (Signed)
 Pharmacy Patient Advocate Encounter  Received notification from [facility] that Prior Authorization for Rybelsus  3mg  has been APPROVED from 03/01/2024 to 03/01/2025. Ran test claim, Copay is $60, without manufacturer coupon, $14.99 WITH coupon. This test claim was processed through [facility]- copay amounts may vary at other pharmacies due to pharmacy/plan contracts, or as the patient moves through the different stages of their insurance plan.

## 2024-04-03 ENCOUNTER — Other Ambulatory Visit

## 2024-05-08 ENCOUNTER — Other Ambulatory Visit: Admitting: Pharmacist

## 2024-05-08 ENCOUNTER — Telehealth: Payer: Self-pay | Admitting: Pharmacist

## 2024-05-08 DIAGNOSIS — E1169 Type 2 diabetes mellitus with other specified complication: Secondary | ICD-10-CM

## 2024-05-08 DIAGNOSIS — E119 Type 2 diabetes mellitus without complications: Secondary | ICD-10-CM

## 2024-05-08 NOTE — Progress Notes (Signed)
 05/08/2024 Name: Chad Rose. MRN: [id number] DOB: [birth year]  Chief Complaint  Patient presents with   Medication Management    Chad Rose. is a 63 y.o. year old male who presented for a telephone visit.   They were referred to the pharmacist by their PCP for assistance in managing diabetes, hyperlipidemia, and medication access.      Subjective:   Care Team: Primary Care Provider: Edman Marsa PARAS, DO Dietician: Katheleen Aleene ORN, RD  Medication Access/Adherence  Current Pharmacy:  [facility] - [place], [place] - [address] [place] [address] Phone: [phone] Fax: [phone]  [facility] - [facility] [address] [place] [address] Phone: [phone] Fax: [phone]   Patient reports affordability concerns with their medications: Yes  Patient reports access/transportation concerns to their pharmacy: No  Patient reports adherence concerns with their medications:  No     Reports recently completed course of prednisone  for poison oak   Diabetes:   Current medications: metformin  ER 500 mg daily   Medications tried in the past: Rybelsus  (was unable to start in the past due to cost); metformin  IR - reports unable to tolerate due to GI side effects  Reports has not yet started Rybelsus  as planned as did not yet have his new [employer] information to provide to his pharmacy. However, has now received card and plans to pick up Rybelsus  prescription   Reports planning to restart using Freestyle Libre 3 Plus continuous glucose monitoring tomorrow. Has not replaced sensor since last month. - Reports has not been checking blood sugar, but suspects recent blood sugar readings elevated due to 6 day course of prednisone , started on 7/29     Current physical activity: Reports has walking at least 1 mile x twice daily     Current medication access support:  none   Objective:  Lab Results  Component Value Date   HGBA1C 7.8 (A) 08/30/2023    Lab Results  Component Value Date   CREATININE 1.24 05/19/2023   BUN 19 05/19/2023   NA 137 05/19/2023   K 4.4 05/19/2023   CL 101 05/19/2023   CO2 26 05/19/2023    Lab Results  Component Value Date   CHOL 261 (H) 05/19/2023   HDL 41 05/19/2023   LDLCALC 155 (H) 05/19/2023   TRIG 385 (H) 05/19/2023   CHOLHDL 6.4 (H) 05/19/2023    Medications Reviewed Today     Reviewed by Alana Sharyle LABOR, RPH-CPP (Pharmacist) on 05/08/24 at 1628  Med List Status: <None>   Medication Order Taking? Sig Documenting Provider Last Dose Status Informant  Continuous Glucose Sensor (FREESTYLE LIBRE 3 PLUS SENSOR) MISC [id number]  Place 1 sensor on the skin every 15 days. Use to check glucose [provider]  Active   metFORMIN  (GLUCOPHAGE -XR) 500 MG 24 hr tablet [id number] Yes Take 1 tablet (500 mg total) by mouth daily with supper. Edman Marsa PARAS, DO  Active   rosuvastatin  (CRESTOR ) 10 MG tablet [id number]  Take 1 tablet (10 mg total) by mouth at bedtime. Edman Marsa PARAS, DO  Active   Semaglutide  (RYBELSUS ) 3 MG TABS [id number]  Take 1 tablet by mouth each morning at least 30 minutes before any food, beverage, or other oral medications with no more than 4 ounces of plain water  only. Edman Marsa PARAS, DO  Active   tamsulosin  (FLOMAX ) 0.4 MG CAPS capsule [id number]  Take 1  capsule (0.4 mg total) by mouth daily. Edman Marsa PARAS, DO  Active               Assessment/Plan:    Diabetes: - Have reviewed dietary modifications including importance of having regular well-balanced meals throughout the day, while controlling carbohydrate portion sizes - Have counseled on Rybelsus , including mechanism of action, side effects, and benefits. No personal or family history of medullary thyroid  cancer, personal history of pancreatitis or gallbladder disease. Counseled on potential  side effects of nausea, stomach upset, queasiness, constipation, and that these generally improve over time. Advised to contact our office with more severe symptoms, including nausea, diarrhea, stomach pain.  - Patient plans to start Rybelsus  3 mg daily. Counsel to take on an empty stomach with up to 4 ounces of plain water  only, >=30 minutes before eating food, drinking beverages, or taking other oral medications - Review counseling on Rybelsus  savings card from manufacturer to further reduce cost - Recommend to use Freestyle Libre 3 CGM to monitor blood sugar/as feedback on dietary choices Patient to check glucose with fingerstick check when needed for symptoms and as back up to CGM. Patient to contact office if needed for readings outside of established parameters or symptoms       Follow Up Plan: Clinical Pharmacist will follow up with patient by telephone in 4 weeks   Sharyle Sia, PharmD, [name], CPP Clinical Pharmacist [facility] [phone]

## 2024-05-08 NOTE — Progress Notes (Addendum)
   Outreach Note  05/08/2024 Name: Chad Rose. MRN: [id number] DOB: 1961/04/13  Referred by: Edman Marsa PARAS, DO  Was unable to reach patient via telephone today and have left HIPAA compliant voicemail asking patient to return my call.    Sharyle Sia, PharmD, [name], CPP Clinical Pharmacist [facility] [phone]

## 2024-05-08 NOTE — Patient Instructions (Signed)
 Goals Addressed             This Visit's Progress    Pharmacy Goals       The goal A1c is less than 7%. This is the best way to reduce the risk of the long term complications of diabetes, including heart disease, kidney disease, eye disease, strokes, and nerve damage. An A1c of less than 7% corresponds with fasting sugars less than 130 and 2 hour after meal sugars less than 180. Please start using the Freestyle Libre 3 Plus to monitor your blood sugar readings.   Our goal bad cholesterol, or LDL, is less than 70 . This is why it is important to continue taking your rosuvastatin.  Thank you!  Estelle Grumbles, PharmD, Patsy [place], CPP Clinical Pharmacist [facility] [phone]

## 2024-06-05 ENCOUNTER — Other Ambulatory Visit: Payer: Self-pay

## 2024-06-05 ENCOUNTER — Telehealth: Payer: Self-pay | Admitting: Pharmacist

## 2024-06-05 NOTE — Progress Notes (Signed)
   Outreach Note  06/05/2024 Name: Chad Rose. MRN: [id number] DOB: 04/03/1961  Referred by: Edman Marsa PARAS, DO  Was unable to reach patient via telephone today and have left HIPAA compliant voicemail asking patient to return my call.    Sharyle Sia, PharmD, [name], CPP Clinical Pharmacist [facility] [phone]

## 2024-07-28 ENCOUNTER — Other Ambulatory Visit: Payer: Self-pay | Admitting: Pharmacist

## 2024-07-28 ENCOUNTER — Other Ambulatory Visit: Payer: Self-pay | Admitting: Family Medicine

## 2024-07-28 DIAGNOSIS — E119 Type 2 diabetes mellitus without complications: Secondary | ICD-10-CM

## 2024-07-28 DIAGNOSIS — E1169 Type 2 diabetes mellitus with other specified complication: Secondary | ICD-10-CM

## 2024-07-28 MED ORDER — FREESTYLE LIBRE 3 PLUS SENSOR MISC: 5 refills | Status: DC

## 2024-07-28 NOTE — Progress Notes (Signed)
 07/28/2024 Name: Chad Rose. MRN: [id number] DOB: [birth year]  Chief Complaint  Patient presents with   Medication Management    Lundy Cozart. is a 63 y.o. year old male who presented for a telephone visit.   They were referred to the pharmacist by their PCP for assistance in managing diabetes, hyperlipidemia, and medication access.      Subjective:   Care Team: Primary Care Provider: Edman Marsa PARAS, DO Dietician: Katheleen Aleene ORN, RD  Medication Access/Adherence  Current Pharmacy:  [facility] - [place], [place] - [address] [place] [address] Phone: [phone] Fax: [phone]  [facility] - [facility] [address] [place] [address] Phone: [phone] Fax: [phone]   Patient reports affordability concerns with their medications: No Patient reports access/transportation concerns to their pharmacy: No  Patient reports adherence concerns with their medications:  No       Diabetes:   Current medications: metformin  ER 500 mg daily   Medications tried in the past: Rybelsus  (was unable to start in the past due to cost); metformin  IR - reports unable to tolerate due to GI side effects  Denies monitoring blood sugar recently. Reports ran out of [employer] 3 Plus sensors and needing a new prescription  Reports has not yet started Rybelsus  as planned as did not yet have his new [employer] information to provide to his pharmacy. However, has now received card and plans to pick up Rybelsus  prescription      Current medication access support: Have provided patient with link for Rybelsus  savings card from manufacturer   Objective:  Lab Results  Component Value Date   HGBA1C 7.8 (A) 08/30/2023    Lab Results  Component Value Date   CREATININE 1.24 05/19/2023   BUN 19 05/19/2023   NA 137 05/19/2023   K 4.4 05/19/2023   CL 101 05/19/2023   CO2 26  05/19/2023    Lab Results  Component Value Date   CHOL 261 (H) 05/19/2023   HDL 41 05/19/2023   LDLCALC 155 (H) 05/19/2023   TRIG 385 (H) 05/19/2023   CHOLHDL 6.4 (H) 05/19/2023    Medications Reviewed Today     Reviewed by Alana Sharyle LABOR, RPH-CPP (Pharmacist) on 07/28/24 at 1332  Med List Status: <None>   Medication Order Taking? Sig Documenting Provider Last Dose Status Informant  Continuous Glucose Sensor (FREESTYLE LIBRE 3 PLUS SENSOR) MISC [id number]  Place 1 sensor on the skin every 15 days. Use to check glucose Edman, Marsa PARAS, DO  Active   metFORMIN  (GLUCOPHAGE -XR) 500 MG 24 hr tablet [id number] Yes Take 1 tablet (500 mg total) by mouth daily with supper. Edman Marsa PARAS, DO  Active   rosuvastatin  (CRESTOR ) 10 MG tablet [id number]  Take 1 tablet (10 mg total) by mouth at bedtime. Edman Marsa PARAS, DO  Active   Semaglutide  (RYBELSUS ) 3 MG TABS [id number]  Take 1 tablet by mouth each morning at least 30 minutes before any food, beverage, or other oral medications with no more than 4 ounces of plain water  only. Edman Marsa PARAS, DO  Active   tamsulosin  (FLOMAX ) 0.4 MG CAPS capsule [id number]  Take 1 capsule (0.4 mg total) by mouth daily. Edman Marsa PARAS, DO  Active               Assessment/Plan:   Diabetes: - Send prescription for [employer] 3 Plus sensors to pharmacy  for patient today - Have reviewed dietary modifications including importance of having regular well-balanced meals throughout the day, while controlling carbohydrate portion sizes - Have counseled on Rybelsus , including mechanism of action, side effects, and benefits. No personal or family history of medullary thyroid  cancer, personal history of pancreatitis or gallbladder disease. Counseled on potential side effects of nausea, stomach upset, queasiness, constipation, and that these generally improve over time. Advised to contact our office with more severe  symptoms, including nausea, diarrhea, stomach pain.  - Patient plans to start Rybelsus  3 mg daily. Counsel to take on an empty stomach with up to 4 ounces of plain water  only, >=30 minutes before eating food, drinking beverages, or taking other oral medications - Review counseling on Rybelsus  savings card from manufacturer to further reduce cost - Recommend to use Freestyle Libre 3 CGM to monitor blood sugar/as feedback on dietary choices Patient to check glucose with fingerstick check when needed for symptoms and as back up to CGM. Patient to contact office if needed for readings outside of established parameters or symptoms       Follow Up Plan: Clinical Pharmacist will follow up with patient by telephone on 08/14/2024 at 1:00 PM    Sharyle Sia, PharmD, [name], CPP Clinical Pharmacist [facility] [phone]

## 2024-07-28 NOTE — Patient Instructions (Signed)
 Goals Addressed             This Visit's Progress    Pharmacy Goals       The goal A1c is less than 7%. This is the best way to reduce the risk of the long term complications of diabetes, including heart disease, kidney disease, eye disease, strokes, and nerve damage. An A1c of less than 7% corresponds with fasting sugars less than 130 and 2 hour after meal sugars less than 180. Please start using the Freestyle Libre 3 Plus to monitor your blood sugar readings.   Our goal bad cholesterol, or LDL, is less than 70 . This is why it is important to continue taking your rosuvastatin.  Thank you!  Estelle Grumbles, PharmD, Patsy [place], CPP Clinical Pharmacist [facility] [phone]

## 2024-08-03 ENCOUNTER — Other Ambulatory Visit (HOSPITAL_COMMUNITY): Payer: Self-pay

## 2024-08-03 ENCOUNTER — Telehealth: Payer: Self-pay | Admitting: Pharmacy Technician

## 2024-08-03 NOTE — Telephone Encounter (Signed)
 Pharmacy Patient Advocate Encounter   Received notification from [employer] that prior authorization for Freestyle Libre 3 Plus sensors is required/requested.   Insurance verification completed.   The patient is insured through [facility].   Per test claim:  Dexcom is preferred by the insurance.  If suggested medication is appropriate, Please send in a new RX and discontinue this one. If not, please advise as to why it's not appropriate so that we may request a Prior Authorization. Please note, some preferred medications may still require a PA.  If the suggested medications have not been trialed and there are no contraindications to their use, the PA will not be submitted, as it will not be approved.  Please be advised that Dexcom would require a prior authorization and patient would need to be treated with insulin.

## 2024-08-03 NOTE — Telephone Encounter (Signed)
 Sharyle, routing this call regarding CGM refill / coverage change.  Is there another way that this patient is receiving Freestyle Libre 3 CGM sensors that we can renew? Or does this mean he would need to be switched to Dexcom. This information states that he would need new PA for Dexcom and fail it due to lack of insulin therapy.  Thanks for your help!  Marsa Officer, DO [facility] 08/03/2024, 4:24 PM

## 2024-08-09 ENCOUNTER — Other Ambulatory Visit: Payer: Self-pay | Admitting: Pharmacist

## 2024-08-09 ENCOUNTER — Other Ambulatory Visit: Payer: Self-pay

## 2024-08-09 ENCOUNTER — Encounter: Payer: Self-pay | Admitting: Pharmacist

## 2024-08-09 ENCOUNTER — Other Ambulatory Visit (HOSPITAL_COMMUNITY): Payer: Self-pay

## 2024-08-09 DIAGNOSIS — E1169 Type 2 diabetes mellitus with other specified complication: Secondary | ICD-10-CM

## 2024-08-09 MED ORDER — TRUEPLUS LANCETS 30G MISC
3 refills | Status: DC
  Filled 2024-08-09: qty 200, 90d supply, fill #0

## 2024-08-09 MED ORDER — TRUE METRIX BLOOD GLUCOSE TEST VI STRP
ORAL_STRIP | 3 refills | Status: AC
  Filled 2024-08-09: qty 200, 90d supply, fill #0

## 2024-08-09 MED ORDER — CONTOUR NEXT TEST VI STRP: ORAL_STRIP | 3 refills | Status: DC

## 2024-08-09 MED ORDER — TRUE METRIX METER W/DEVICE KIT
PACK | 0 refills | Status: DC
  Filled 2024-08-09 – 2024-08-10 (×2): qty 1, 30d supply, fill #0

## 2024-08-09 MED ORDER — RYBELSUS 3 MG PO TABS
ORAL_TABLET | ORAL | 0 refills | Status: DC
  Filled 2024-08-09: qty 30, 30d supply, fill #0

## 2024-08-09 MED ORDER — MICROLET LANCETS MISC: 3 refills | Status: DC

## 2024-08-09 NOTE — Patient Instructions (Signed)
 Please follow up with [facility] regarding picking up your medications.   [facility] [address] [place], [place] [address] [phone]   Please let me know if you have any medication related questions or concerns!   Sharyle Sia, PharmD, [name], CPP Clinical Pharmacist [facility] [phone]

## 2024-08-09 NOTE — Progress Notes (Signed)
 08/09/2024 Name: Chad Rose. MRN: [id number] DOB: [birth year]  Chief Complaint  Patient presents with   Medication Management   Medication Adherence    Chad Rose. is a 63 y.o. year old male who was referred to the pharmacist by their PCP for assistance in managing diabetes, hyperlipidemia, and medication access.    Received a message from office last week regarding Freestyle Libre sensors not being covered through patient's new [employer] prescription plan.  - Completed CGM PA through patient's [employer] plan and received denial; CGM is not covered for him as this new plan requires patients to be on insulin for coverage eligibility.   Subjective:   Care Team: Primary Care Provider: Edman Chad Rose PARAS, DO Dietician: Chad Rose, RD  Medication Access/Adherence  Current Pharmacy:  [facility] - [place], [place] - [address] [place] [address] Phone: [phone] Fax: [phone]  [facility] - [facility] [address] [place] [address] Phone: [phone] Fax: [phone]   Patient reports affordability concerns with their medications: No Patient reports access/transportation concerns to their pharmacy: No  Patient reports adherence concerns with their medications:  No     Follow up with patient today and provide update regarding denial from his new insurance plan for continuous glucose monitoring coverage. Review cost of CGM without insurance.   Diabetes:   Current medications: metformin  ER 500 mg daily   Medications tried in the past: Rybelsus  (was unable to start in the past due to cost); metformin  IR - reports unable to tolerate due to GI side effects   Denies monitoring blood sugar recently. Reports due to out of pocket cost of CGM, prefers to restart monitoring his blood sugar using fingerstick check. Reports needing new prescriptions for testing supplies (test strips and  lancets) to use with his [employer] Contour Next meter    Reports has not yet started Rybelsus  as has not yet picked this up from pharmacy      Current medication access support: Have provided patient with link for Rybelsus  savings card from manufacturer     Objective:  Lab Results  Component Value Date   HGBA1C 7.8 (A) 08/30/2023    Lab Results  Component Value Date   CREATININE 1.24 05/19/2023   BUN 19 05/19/2023   NA 137 05/19/2023   K 4.4 05/19/2023   CL 101 05/19/2023   CO2 26 05/19/2023    Medications Reviewed Today     Reviewed by Chad Rose, RPH-CPP (Pharmacist) on 08/09/24 at [phone]  Med List Status: <None>   Medication Order Taking? Sig Documenting Provider Last Dose Status Informant  Continuous Glucose Sensor (FREESTYLE LIBRE 3 PLUS SENSOR) MISC [id number]  Place 1 sensor on the skin every 15 days. Use to check glucose  Patient not taking: Reported on 08/09/2024   Chad Chad Rose PARAS, DO  Active   glucose blood (CONTOUR NEXT TEST) test strip [id number] Yes Use to check blood sugar up to twice daily Chad Chad Rose PARAS, DO  Active   metFORMIN  (GLUCOPHAGE -XR) 500 MG 24 hr tablet [id number]  Take 1 tablet (500 mg total) by mouth daily with supper. Chad Chad Rose PARAS, DO  Active   Microlet Lancets MISC [id number] Yes Use to check blood sugar up to twice daily Chad Chad Rose PARAS, DO  Active   rosuvastatin  (CRESTOR ) 10 MG tablet [id number]  Take 1 tablet (10 mg total) by mouth at bedtime. Chad, Chad Rose PARAS,  DO  Active   Semaglutide  (RYBELSUS ) 3 MG TABS [id number]  Take 1 tablet by mouth each morning at least 30 minutes before any food, beverage, or other oral medications with no more than 4 ounces of plain water  only. Chad Chad Rose PARAS, DO  Active   tamsulosin  (FLOMAX ) 0.4 MG CAPS capsule [id number]  Take 1 capsule (0.4 mg total) by mouth daily. Chad Chad Rose PARAS, DO  Active               Assessment/Plan:   Diabetes: -  Send prescriptions for testing supplies (Contour Next test strips and Microlet lancets) to pharmacy for patient today Follow up with [employer] and am advised that, while covered through patient's plan, the cost for Contour test strips is >$100 for 100 strips - Patient interested in more affordable glucose monitoring. Discuss options. Agree to send prescriptions for True Metrix meter and testing supplies to [facility] for patient. As requested, will also send patient's Rybelsus  prescription to [facility].  Download and provide copay savings card details to pharmacy for patient:  BIN: [id number]    PCN: [id number]    Group: [id number]    ID: [id number]  - Have reviewed dietary modifications including importance of having regular well-balanced meals throughout the day, while controlling carbohydrate portion sizes - Have counseled on Rybelsus , including mechanism of action, side effects, and benefits. No personal or family history of medullary thyroid  cancer, personal history of pancreatitis or gallbladder disease. Counseled on potential side effects of nausea, stomach upset, queasiness, constipation, and that these generally improve over time. Advised to contact our office with more severe symptoms, including nausea, diarrhea, stomach pain.  - Have reviewed counseling on Rybelsus  savings card from manufacturer to further reduce cost - Recommend to monitor blood sugar, keep log of results and have this record to review at upcoming medical appointments. Patient to contact provider office sooner if needed for readings outside of established parameters or symptoms       Follow Up Plan: Clinical Pharmacist will follow up with patient by telephone on 08/28/2024 at 11:00 AM     Chad Rose, PharmD, Chad Rose, CPP Clinical Pharmacist [facility] [phone]

## 2024-08-10 ENCOUNTER — Other Ambulatory Visit: Payer: Self-pay

## 2024-08-10 MED ORDER — TRUEPLUS LANCETS 33G MISC
3 refills | Status: AC
  Filled 2024-08-10 (×2): qty 200, 90d supply, fill #0

## 2024-08-10 NOTE — Addendum Note (Signed)
 Addended by: ALANA FEND A on: 08/10/2024 11:35 AM   Modules accepted: Orders

## 2024-08-14 ENCOUNTER — Other Ambulatory Visit: Payer: Self-pay

## 2024-08-28 ENCOUNTER — Telehealth: Payer: Self-pay | Admitting: Pharmacist

## 2024-08-28 ENCOUNTER — Other Ambulatory Visit: Payer: Self-pay | Admitting: Pharmacist

## 2024-08-28 ENCOUNTER — Other Ambulatory Visit: Payer: Self-pay

## 2024-08-28 DIAGNOSIS — E1169 Type 2 diabetes mellitus with other specified complication: Secondary | ICD-10-CM

## 2024-08-28 DIAGNOSIS — E119 Type 2 diabetes mellitus without complications: Secondary | ICD-10-CM

## 2024-08-28 DIAGNOSIS — N401 Enlarged prostate with lower urinary tract symptoms: Secondary | ICD-10-CM

## 2024-08-28 MED ORDER — ROSUVASTATIN CALCIUM 10 MG PO TABS
10.0000 mg | ORAL_TABLET | Freq: Every day | ORAL | 3 refills | Status: AC
  Filled 2024-08-28 – 2024-09-07 (×2): qty 90, 90d supply, fill #0

## 2024-08-28 MED ORDER — TAMSULOSIN HCL 0.4 MG PO CAPS
0.4000 mg | ORAL_CAPSULE | Freq: Every day | ORAL | 3 refills | Status: AC
  Filled 2024-08-28 – 2024-09-07 (×2): qty 90, 90d supply, fill #0

## 2024-08-28 MED ORDER — RYBELSUS 7 MG PO TABS
ORAL_TABLET | ORAL | 2 refills | Status: AC
  Filled 2024-08-28 – 2024-09-07 (×2): qty 30, 30d supply, fill #0
  Filled 2024-10-11: qty 30, 30d supply, fill #1

## 2024-08-28 MED ORDER — METFORMIN HCL ER 500 MG PO TB24
500.0000 mg | ORAL_TABLET | Freq: Every day | ORAL | 3 refills | Status: AC
  Filled 2024-08-28 – 2024-09-07 (×2): qty 90, 90d supply, fill #0

## 2024-08-28 NOTE — Patient Instructions (Signed)
 Goals Addressed             This Visit's Progress    Pharmacy Goals       The goal A1c is less than 7%. This is the best way to reduce the risk of the long term complications of diabetes, including heart disease, kidney disease, eye disease, strokes, and nerve damage. An A1c of less than 7% corresponds with fasting sugars less than 130 and 2 hour after meal sugars less than 180. Please start using the Freestyle Libre 3 Plus to monitor your blood sugar readings.   Our goal bad cholesterol, or LDL, is less than 70 . This is why it is important to continue taking your rosuvastatin.  Thank you!  Estelle Grumbles, PharmD, Patsy [place], CPP Clinical Pharmacist [facility] [phone]

## 2024-08-28 NOTE — Progress Notes (Signed)
 08/28/2024 Name: Chad Rose. MRN: [id number] DOB: 02/27/1961  Chief Complaint  Patient presents with   Medication Management   Medication Assistance   Medication Adherence    Chad Gilbo. is a 63 y.o. year old male who presented for a telephone visit.   They were referred to the pharmacist by their PCP for assistance in managing diabetes, hyperlipidemia, and medication access.      Subjective:   Care Team: Primary Care Provider: Edman Marsa PARAS, DO Dietician: Katheleen Aleene ORN, RD  Medication Access/Adherence  Current Pharmacy:  [facility] - [place], [place] - [address] [place] [address] Phone: [phone] Fax: [phone]  [facility] - [facility] [address] [place] [address] Phone: [phone] Fax: [phone]   Patient reports affordability concerns with their medications: No Patient reports access/transportation concerns to their pharmacy: No  Patient reports adherence concerns with their medications:  No     Based on dispensing history, query adherence to rosuvastatin . Per dispensing record, rosuvastatin  prescription last filled 03/22/2024 for 30 day supply - Patient denies missed doses, but states that he is in need of a refill  Reports pharmacy did not have TrueMetrix meter/supplies in stock when went to pick up and instead received Accu Check Guide meter and supplies  Diabetes:   Current medications:  - metformin  ER 500 mg daily - Rybelsus  3 mg daily before breakfast - Reports started ~08/10/2024 Confirms taking  on an empty stomach with up to 4 ounces of plain water  only, >=30 minutes before eating food, drinking beverages, or taking other oral medications Reports had some stomach upset with start, but now tolerating well    Medications tried in the past: metformin  IR - reports unable to tolerate due to GI side effects  Today before breakfast: 179 (but missed  metformin  dose yesterday) - Using Accu Check Guide meter  - Previously using Freestyle Libre 3 Plus CGM, but not covered through current insurance for him      Current medication access support: Rybelsus  savings card from manufacturer   Objective:  Lab Results  Component Value Date   HGBA1C 7.8 (A) 08/30/2023    Lab Results  Component Value Date   CREATININE 1.24 05/19/2023   BUN 19 05/19/2023   NA 137 05/19/2023   K 4.4 05/19/2023   CL 101 05/19/2023   CO2 26 05/19/2023    Lab Results  Component Value Date   CHOL 261 (H) 05/19/2023   HDL 41 05/19/2023   LDLCALC 155 (H) 05/19/2023   TRIG 385 (H) 05/19/2023   CHOLHDL 6.4 (H) 05/19/2023    Medications Reviewed Today     Reviewed by Chad Rose, RPH-CPP (Pharmacist) on 08/28/24 at 1127  Med List Status: <None>   Medication Order Taking? Sig Documenting Provider Last Dose Status Informant  Blood Glucose Monitoring Suppl (ACCU-CHEK GUIDE) w/Device KIT [id number] Yes by Does not apply route. [provider]  Active     Discontinued 08/28/24 1127    Patient not taking:   Discontinued 08/28/24 1126   glucose blood (TRUE METRIX BLOOD GLUCOSE TEST) test strip [id number]  Use to check blood sugar up to twice daily Edman Marsa PARAS, DO  Active   metFORMIN  (GLUCOPHAGE -XR) 500 MG 24 hr tablet [id number]  Take 1 tablet (500 mg total) by mouth daily with supper. Edman Marsa PARAS, DO  Active   rosuvastatin  (CRESTOR ) 10 MG tablet [id number]  Take 1 tablet (10 mg total) by mouth at bedtime. Karamalegos, Marsa PARAS, DO  Active   Semaglutide  (RYBELSUS ) 3 MG TABS [id number]  Take 1 tablet by mouth each morning at least 30 minutes before any food, beverage, or other oral medications with no more than 4 ounces of plain water  only. Edman Marsa PARAS, DO  Active   tamsulosin  (FLOMAX ) 0.4 MG CAPS capsule [id number]  Take 1 capsule (0.4 mg total) by mouth daily. Edman Marsa PARAS, DO  Active   TRUEplus  Lancets 33G MISC [id number]  Use to check blood sugar up to twice daily Edman Marsa PARAS, DO  Active               Assessment/Plan:   Identify patient in need of refill/renewal of metformin  ER, rosuvastatin  and tamsulosin  prescriptions Collaborate with PCP/office to request renewal of metformin  ER, rosuvastatin  and tamsulosin  prescriptions be sent to [facility] for patient  Advised patient to contact office today to reschedule missed appointment with PCP  Diabetes: - Currently uncontrolled - Have reviewed long term cardiovascular and renal outcomes of uncontrolled blood sugar - Have reviewed dietary modifications including importance of having regular well-balanced meals throughout the day, while controlling carbohydrate portion sizes - Advise patient that he may increase Rybelsus  dose to 7 mg once daily, to start once finishes current 37-month supply of Rybelsus  3 mg dose  Send prescription for Rybelsus  7 mg daily to pharmacy for patient  - Recommend to monitor home blood sugar, keep log of results and have this record to review at upcoming medical appointments. Patient to contact provider office sooner if needed for readings outside of established parameters or symptoms        Follow Up Plan: Clinical Pharmacist will follow up with patient by telephone on 10/09/2024 at 11:00 AM     Sharyle Sia, PharmD, [name], CPP Clinical Pharmacist [facility] [phone]

## 2024-08-28 NOTE — Telephone Encounter (Signed)
 Spoke to patient. Notified prescriptions sent in. And appointment scheduled

## 2024-08-28 NOTE — Progress Notes (Signed)
 Patient requesting renewals of metformin  ER, rosuvastatin  and tamsulosin  prescriptions be sent to [facility] for him.  Note patient due for PCP appointment. Advised him to contact office today to reschedule missed appointment with Dr. MARLA  Thank you!  Sharyle Sia, PharmD, [facility] [phone]

## 2024-08-28 NOTE — Telephone Encounter (Signed)
 [facility] Clinical - yes okay to re-schedule him. And notify him I have signed his orders for med refills to [facility].  FYI to Sharyle Marsa Officer, DO [facility] 08/28/2024, 12:15 PM

## 2024-09-06 ENCOUNTER — Ambulatory Visit: Payer: Self-pay | Admitting: Family Medicine

## 2024-09-06 VITALS — BP 124/78 | HR 90 | Ht 69.0 in | Wt 175.0 lb

## 2024-09-06 DIAGNOSIS — R351 Nocturia: Secondary | ICD-10-CM | POA: Diagnosis not present

## 2024-09-06 DIAGNOSIS — Z0000 Encounter for general adult medical examination without abnormal findings: Secondary | ICD-10-CM | POA: Diagnosis not present

## 2024-09-06 DIAGNOSIS — M19041 Primary osteoarthritis, right hand: Secondary | ICD-10-CM

## 2024-09-06 DIAGNOSIS — N401 Enlarged prostate with lower urinary tract symptoms: Secondary | ICD-10-CM | POA: Diagnosis not present

## 2024-09-06 DIAGNOSIS — K5903 Drug induced constipation: Secondary | ICD-10-CM

## 2024-09-06 DIAGNOSIS — M19042 Primary osteoarthritis, left hand: Secondary | ICD-10-CM

## 2024-09-06 DIAGNOSIS — N529 Male erectile dysfunction, unspecified: Secondary | ICD-10-CM | POA: Diagnosis not present

## 2024-09-06 DIAGNOSIS — E038 Other specified hypothyroidism: Secondary | ICD-10-CM | POA: Diagnosis not present

## 2024-09-06 DIAGNOSIS — R109 Unspecified abdominal pain: Secondary | ICD-10-CM

## 2024-09-06 DIAGNOSIS — E1142 Type 2 diabetes mellitus with diabetic polyneuropathy: Secondary | ICD-10-CM

## 2024-09-06 DIAGNOSIS — Z7984 Long term (current) use of oral hypoglycemic drugs: Secondary | ICD-10-CM | POA: Diagnosis not present

## 2024-09-06 DIAGNOSIS — E1169 Type 2 diabetes mellitus with other specified complication: Secondary | ICD-10-CM

## 2024-09-06 DIAGNOSIS — E785 Hyperlipidemia, unspecified: Secondary | ICD-10-CM | POA: Diagnosis not present

## 2024-09-06 DIAGNOSIS — E559 Vitamin D deficiency, unspecified: Secondary | ICD-10-CM | POA: Diagnosis not present

## 2024-09-06 MED ORDER — SILDENAFIL CITRATE 20 MG PO TABS: ORAL_TABLET | ORAL | 3 refills | Status: AC

## 2024-09-06 NOTE — Patient Instructions (Addendum)
 Thank you for coming to the office today.  Labs today  Stay tuned for results  Foot check normal  No vaccines today  Start Sildenafil 20mg  - take 1 tablet about 30 min prior to sexual intercourse for improved erection. If this dose does not work or is not strong enough NEXT TIME you can increase to 2 pills for 40mg . Maximum dose is 5 pills or 100mg , most people end up taking 3-4 pills per dose and this decision is up to you based on the results.  Once you take a dose, you have to wait 24 hours to repeat a dose.  You will need to use www.goodrx.com website or app on phone to enter Sildenafil 20mg  medication and Get Free Coupon option to save for [facility] pharmacy once you select pharmacy and # of pills. Show that coupon to pharmacy. Otherwise it will cost hundreds of dollars.  Follow up if not working or new concerns we can refer you to a Urologist.   You have been referred for a Coronary Calcium  Score Cardiac CT Scan. This is a screening test for patients aged 61-50+ with cardiovascular risk factors or who are healthy but would be interested in Cardiovascular Screening for heart disease. Even if there is a family history of heart disease, this imaging can be useful. Typically it can be done every 5+ years or at a different timeline we agree on  The scan will look at the chest and mainly focus on the heart and identify early signs of calcium  build up or blockages within the heart arteries. It is not 100% accurate for identifying blockages or heart disease, but it is useful to help us  predict who may have some early changes or be at risk in the future for a heart attack or cardiovascular problem.  The results are reviewed by a Cardiologist and they will document the results. It should become available on MyChart. Typically the results are divided into percentiles based on other patients of the same demographic and age. So it will compare your risk to others similar to you. If you have a  higher score >99 or higher percentile >75%tile, it is recommended to consider Statin cholesterol therapy and or referral to Cardiologist. I will try to help explain your results and if we have questions we can contact the Cardiologist.  You will be contacted for scheduling. Usually it is done at any imaging facility through [facility], [facility] or [facility].  The cost is $99 flat fee total and it does not go through insurance, so no authorization is required.   ----------------------  For Constipation (less frequent bowel movement that can be hard dry or involve straining).  Recommend trying OTC Miralax 17g = 1 capful in large glass water  once daily for now, try several days to see if working, goal is soft stool or BM 1-2 times daily, if too loose then reduce dose or try every other day. If not effective may need to increase it to 2 doses at once in AM or may do 1 in morning and 1 in afternoon/evening  - This medicine is very safe and can be used often without any problem and will not make you dehydrated. It is good for use on AS NEEDED BASIS or even MAINTENANCE therapy for longer term for several days to weeks at a time to help regulate bowel movements  Other more natural remedies or preventative treatment: - Increase hydration with water  - Increase fiber in diet (high  fiber foods = vegetables, leafy greens, oats/grains) - May take OTC Fiber supplement (metamucil powder or pill/gummy) - May try OTC Probiotic    Please schedule a Follow-up Appointment to: Return in about 6 months (around 03/07/2025) for 6 month DM A1c.  If you have any other questions or concerns, please feel free to call the office or send a message through MyChart. You may also schedule an earlier appointment if necessary.  Additionally, you may be receiving a survey about your experience at our office within a few days to 1 week by e-mail or mail. We value your feedback.  Marsa Officer, DO [facility], [place]

## 2024-09-06 NOTE — Progress Notes (Signed)
 Subjective:    Patient ID: Chad LOISE Juli Mickey., male    DOB: 10-11-1960, 63 y.o.   MRN: [id number]  Chad Casebolt. is a 63 y.o. male presenting on 09/06/2024 for Annual Exam   HPI  Discussed the use of AI scribe software for clinical note transcription with the patient, who gave verbal consent to proceed.  History of Present Illness   Chad Ramesh. is a 63 year old male who presents for an annual physical exam.   Constipation / Bowel habit changes Chronic constipation can have very slow bowel movements regularly Now worse on Rybelsus , some cramping pain  Cerumen Impaction Ear symptoms - Trouble with ear wax buildup, particularly in one ear - No hearing changes or pressure  CHRONIC DM, Type 2: Overdue for A1c, last was 1 year ago, now here for labs Some issues with constipation worsening on Rybelsus  abdominal cramping episodic. Unable to tolerate initial Metformin  IR 500mg  daily, now tolerating XR 500mg  daily Meds: oral Rybelsus  7mg  daily, Metformin  XR 500mg  daily - Diet (improving diabetic diet, low carb) - Exercise (active regular exercising) Due for updated DM Eye exam Admits foot tingling stable Peripheral neuropathy symptoms - Intermittent foot tingling - No major issues with feet currently Denies hypoglycemia, polyuria, visual changes, numbness.  HYPERLIPIDEMIA: - Reports concerns. Last lipid panel 04/2023, elevated LDL 155 - Currently taking Rosuvastatin  10mg , tolerating well without side effects or myalgias   BPH Insomnia, poor sleep Related to nocturia He is on Tamsulosin  0.4mg  daily, some improved urinary flow but not impacting his night time symptoms  Erectile Dysfunction New problem 4+ months Never on med Reduced erectile function, unable to obtain full erection.   Health Maintenance: Declines vaccines today     09/06/2024    2:10 PM 08/30/2023    4:25 PM 06/30/2023    8:26 AM  Depression screen PHQ 2/9  Decreased Interest 0 0 0   Down, Depressed, Hopeless 0 0 0  PHQ - 2 Score 0 0 0  Altered sleeping  0   Tired, decreased energy  0   Change in appetite  0   Feeling bad or failure about yourself   0   Trouble concentrating  0   Moving slowly or fidgety/restless  0   Suicidal thoughts  0   PHQ-9 Score  0    Difficult doing work/chores  Not difficult at all      Data saved with a previous flowsheet row definition       08/30/2023    4:26 PM 05/26/2023   10:52 AM 02/16/2023   11:44 AM  GAD 7 : Generalized Anxiety Score  Nervous, Anxious, on Edge 0 0 0  Control/stop worrying 0 0 0  Worry too much - different things 0 0 0  Trouble relaxing 0 0 0  Restless 0 0 0  Easily annoyed or irritable 0 0 0  Afraid - awful might happen 0 0 0  Total GAD 7 Score 0 0 0  Anxiety Difficulty Not difficult at all       Past Medical History:  Diagnosis Date   Asthma    Pre-diabetes    Past Surgical History:  Procedure Laterality Date   BACK SURGERY     COLONOSCOPY WITH PROPOFOL  N/A 04/21/2018   Procedure: COLONOSCOPY WITH PROPOFOL ;  Surgeon: Jinny Carmine, MD;  Location: [facility];  Service: Endoscopy;  Laterality: N/A;   POLYPECTOMY  04/21/2018   Procedure: POLYPECTOMY;  Surgeon: Jinny Carmine, MD;  Location: [facility];  Service: Endoscopy;;   Social History   Socioeconomic History   Marital status: Married    Spouse name: Not on file   Number of children: 3   Years of education: High School   Highest education level: 12th grade  Occupational History   Occupation: Nurse, Mental Health    Comment: [place] / [place]  Tobacco Use   Smoking status: Never   Smokeless tobacco: Never  Vaping Use   Vaping status: Never Used  Substance and Sexual Activity   Alcohol use: Yes    Alcohol/week: 5.0 standard drinks of alcohol    Types: 5 Cans of beer per week   Drug use: No   Sexual activity: Not on file  Other Topics Concern   Not on file  Social History Narrative   Not on file   Social Drivers  of Health   Financial Resource Strain: Low Risk  (09/06/2024)   Overall Financial Resource Strain (CARDIA)    Difficulty of Paying Living Expenses: Not hard at all  Food Insecurity: Unknown (09/06/2024)   Hunger Vital Sign    Worried About Running Out of Food in the Last Year: Never true    Ran Out of Food in the Last Year: Not on file  Transportation Needs: No Transportation Needs (09/06/2024)   PRAPARE - Administrator, Civil Service (Medical): No    Lack of Transportation (Non-Medical): No  Physical Activity: Insufficiently Active (08/29/2023)   Exercise Vital Sign    Days of Exercise per Week: 3 days    Minutes of Exercise per Session: 10 min  Stress: No Stress Concern Present (08/29/2023)   [employer] of Occupational Health - Occupational Stress Questionnaire    Feeling of Stress : Not at all  Social Connections: Unknown (09/06/2024)   Social Connection and Isolation Panel    Frequency of Communication with Friends and Family: Twice a week    Frequency of Social Gatherings with Friends and Family: Twice a week    Attends Religious Services: More than 4 times per year    Active Member of [employer] or Organizations: Patient declined    Attends Engineer, Structural: Not on file    Marital Status: Married  Catering Manager Violence: Not on file   Family History  Problem Relation Age of Onset   Throat cancer Mother    Alcohol abuse Father    Cirrhosis Father    Pneumonia Father    Diabetes Neg Hx    Prostate cancer Neg Hx    Colon cancer Neg Hx    Current Outpatient Medications on File Prior to Visit  Medication Sig   Blood Glucose Monitoring Suppl (ACCU-CHEK GUIDE) w/Device KIT by Does not apply route.   glucose blood (TRUE METRIX BLOOD GLUCOSE TEST) test strip Use to check blood sugar up to twice daily   metFORMIN  (GLUCOPHAGE -XR) 500 MG 24 hr tablet Take 1 tablet (500 mg total) by mouth daily with supper.   rosuvastatin  (CRESTOR ) 10 MG tablet Take 1  tablet (10 mg total) by mouth at bedtime.   Semaglutide  (RYBELSUS ) 7 MG TABS Take 1 tablet by mouth each morning at least 30 minutes before any food, beverage, or other oral medications with no more than 4 ounces of plain water  only.   tamsulosin  (FLOMAX ) 0.4 MG CAPS capsule Take 1 capsule (0.4 mg total) by mouth daily.   TRUEplus Lancets 33G MISC Use to check blood sugar up to twice daily   No  current facility-administered medications on file prior to visit.    Review of Systems  Constitutional:  Negative for activity change, appetite change, chills, diaphoresis, fatigue and fever.  HENT:  Negative for congestion and hearing loss.   Eyes:  Negative for visual disturbance.  Respiratory:  Negative for cough, chest tightness, shortness of breath and wheezing.   Cardiovascular:  Negative for chest pain, palpitations and leg swelling.  Gastrointestinal:  Negative for abdominal pain, constipation, diarrhea, nausea and vomiting.  Genitourinary:  Negative for dysuria, frequency and hematuria.  Musculoskeletal:  Negative for arthralgias and neck pain.  Skin:  Negative for rash.  Neurological:  Negative for dizziness, weakness, light-headedness, numbness and headaches.  Hematological:  Negative for adenopathy.  Psychiatric/Behavioral:  Negative for behavioral problems, dysphoric mood and sleep disturbance.    Per HPI unless specifically indicated above     Objective:    BP 124/78 (BP Location: Right Arm, Patient Position: Sitting, Cuff Size: Normal)   Pulse 90   Ht 5' 9 (1.753 m)   Wt 175 lb (79.4 kg)   SpO2 95%   BMI 25.84 kg/m   Wt Readings from Last 3 Encounters:  09/06/24 175 lb (79.4 kg)  08/30/23 177 lb 12.8 oz (80.6 kg)  05/26/23 175 lb (79.4 kg)    Physical Exam Vitals and nursing note reviewed.  Constitutional:      General: He is not in acute distress.    Appearance: He is well-developed. He is not diaphoretic.     Comments: Well-appearing, comfortable, cooperative   HENT:     Head: Normocephalic and atraumatic.     Right Ear: There is impacted cerumen.     Left Ear: There is impacted cerumen.  Eyes:     General:        Right eye: No discharge.        Left eye: No discharge.     Conjunctiva/sclera: Conjunctivae normal.     Pupils: Pupils are equal, round, and reactive to light.  Neck:     Thyroid : No thyromegaly.     Vascular: No carotid bruit.  Cardiovascular:     Rate and Rhythm: Normal rate and regular rhythm.     Pulses: Normal pulses.     Heart sounds: Normal heart sounds. No murmur heard. Pulmonary:     Effort: Pulmonary effort is normal. No respiratory distress.     Breath sounds: Normal breath sounds. No wheezing or rales.  Abdominal:     General: Bowel sounds are normal. There is no distension.     Palpations: Abdomen is soft. There is no mass.     Tenderness: There is no abdominal tenderness.  Musculoskeletal:        General: No tenderness. Normal range of motion.     Cervical back: Normal range of motion and neck supple.     Right lower leg: No edema.     Left lower leg: No edema.     Comments: Upper / Lower Extremities: - Normal muscle tone, strength bilateral upper extremities 5/5, lower extremities 5/5  Lymphadenopathy:     Cervical: No cervical adenopathy.  Skin:    General: Skin is warm and dry.     Findings: No erythema or rash.  Neurological:     Mental Status: He is alert and oriented to person, place, and time.     Comments: Distal sensation intact to light touch all extremities  Psychiatric:        Mood and Affect: Mood normal.  Behavior: Behavior normal.        Thought Content: Thought content normal.     Comments: Well groomed, good eye contact, normal speech and thoughts     Diabetic Foot Exam - Simple   Simple Foot Form Diabetic Foot exam was performed with the following findings: Yes 09/06/2024  1:53 PM  Visual Inspection No deformities, no ulcerations, no other skin breakdown bilaterally:  Yes Sensation Testing Intact to touch and monofilament testing bilaterally: Yes Pulse Check Posterior Tibialis and Dorsalis pulse intact bilaterally: Yes Comments      Results for orders placed or performed in visit on 08/30/23  POCT HgB A1C   Collection Time: 08/30/23  4:30 PM  Result Value Ref Range   Hemoglobin A1C 7.8 (A) 4.0 - 5.6 %   HbA1c POC (<> result, manual entry)     HbA1c, POC (prediabetic range)     HbA1c, POC (controlled diabetic range)        Assessment & Plan:   Problem List Items Addressed This Visit     Benign prostatic hyperplasia with nocturia   Relevant Orders   PSA   Hyperlipidemia associated with type 2 diabetes mellitus (HCC)   Relevant Medications   sildenafil (REVATIO) 20 MG tablet   Other Relevant Orders   Lipid panel   Comprehensive metabolic panel with GFR   CT CARDIAC SCORING (SELF PAY ONLY)   Primary osteoarthritis of both hands   Relevant Orders   CBC with Differential/Platelet   Comprehensive metabolic panel with GFR   Subclinical hypothyroidism   Relevant Orders   TSH   T4, free   Type 2 diabetes mellitus with other specified complication (HCC)   Vitamin D  deficiency   Relevant Orders   VITAMIN D  25 Hydroxy (Vit-D Deficiency, Fractures)   Other Visit Diagnoses       Annual physical exam    -  Primary   Relevant Orders   Lipid panel   Hemoglobin A1c   CBC with Differential/Platelet   PSA   Microalbumin / creatinine urine ratio   TSH   Comprehensive metabolic panel with GFR     Erectile dysfunction, unspecified erectile dysfunction type       Relevant Medications   sildenafil (REVATIO) 20 MG tablet     Drug induced constipation         Abdominal cramping         Long term current use of oral hypoglycemic drug            Updated Health Maintenance information Non fasting labs today Encouraged improvement to lifestyle with diet and exercise Goal of weight loss   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 / chronic constipation history present - Continue Rybelsus  7 mg daily, Metformin  XR 500mg  daily - Recommended bowel regimen with Miralax or fiber supplements. - DM Foot exam benign today, supportive care and monitoring  Hyperlipidemia Secondary to diabetes Previous elevated LDL 150+ On Rosuvastatin  10mg  daily Pending labs  Benign prostatic hyperplasia with nocturia - Continue current management Tamsulosin   History elevated TSH Check labs TSH T4 Not on therapy  Erectile dysfunction New problem. Likely multifactorial. No prior treatment. - Prescribed sildenafil 20 mg, start low and titrate as needed. - Discussed GoodRx for cost savings. - Monitor response, consider Cialis vs urology referral if no  improvement.  Drug induced constipation with abdominal cramping Likely due to Rybelsus  and baseline slow bowel movements. - Recommended bowel regimen with Miralax or fiber supplements.  - Ordered vitamin D  level.  General Health Maintenance Routine health maintenance discussed. Declined certain vaccines. Eye exam pending. Normal foot exam. Blood pressure and weight normal. - Ordered blood and urine tests including prostate check, cholesterol, sugar, blood counts, kidney, liver, chemistry, and vitamin D . - Ordered heart scan (CT scan of the chest). - Schedule eye exam and send report to office. - Monitor blood pressure and weight.       CC chart to Sharyle Sia [facility] CPP  Orders Placed This Encounter  Procedures   CT CARDIAC SCORING (SELF PAY ONLY)    Standing Status:   Future    Expiration Date:   09/06/2025    Preferred imaging location?:   [facility]   Lipid panel    Has the patient fasted?:   No   Hemoglobin A1c   CBC with  Differential/Platelet   PSA   Microalbumin / creatinine urine ratio   TSH   Comprehensive metabolic panel with GFR    Has the patient fasted?:   No   VITAMIN D  25 Hydroxy (Vit-D Deficiency, Fractures)   T4, free    Meds ordered this encounter  Medications   sildenafil (REVATIO) 20 MG tablet    Sig: Take 1-5 pills about 30 min prior to sex. Start with 1 and increase as needed.    Dispense:  90 tablet    Refill:  3    Goodrx.com coupon     Follow up plan: Return in about 6 months (around 03/07/2025) for 6 month DM A1c.  Marsa Officer, DO [facility] 09/06/2024, 1:48 PM

## 2024-09-07 ENCOUNTER — Other Ambulatory Visit: Payer: Self-pay

## 2024-09-07 LAB — CBC WITH DIFFERENTIAL/PLATELET
Absolute Lymphocytes: 2236 {cells}/uL (ref 850–3900)
Absolute Monocytes: 715 {cells}/uL (ref 200–950)
Basophils Absolute: 39 {cells}/uL (ref 0–200)
Basophils Relative: 0.6 %
Eosinophils Absolute: 72 {cells}/uL (ref 15–500)
Eosinophils Relative: 1.1 %
HCT: 44.2 % (ref 39.4–51.1)
Hemoglobin: 14.7 g/dL (ref 13.2–17.1)
MCH: 30 pg (ref 27.0–33.0)
MCHC: 33.3 g/dL (ref 31.6–35.4)
MCV: 90.2 fL (ref 81.4–101.7)
MPV: 9.6 fL (ref 7.5–12.5)
Monocytes Relative: 11 %
Neutro Abs: 3439 {cells}/uL (ref 1500–7800)
Neutrophils Relative %: 52.9 %
Platelets: 208 Thousand/uL (ref 140–400)
RBC: 4.9 Million/uL (ref 4.20–5.80)
RDW: 12.1 % (ref 11.0–15.0)
Total Lymphocyte: 34.4 %
WBC: 6.5 Thousand/uL (ref 3.8–10.8)

## 2024-09-07 LAB — HEMOGLOBIN A1C
Hgb A1c MFr Bld: 7.9 % — ABNORMAL HIGH (ref <5.7)
Mean Plasma Glucose: 180 mg/dL
eAG (mmol/L): 10 mmol/L

## 2024-09-07 LAB — COMPREHENSIVE METABOLIC PANEL WITH GFR
AG Ratio: 1.5 (calc) (ref 1.0–2.5)
ALT: 22 U/L (ref 9–46)
AST: 20 U/L (ref 10–35)
Albumin: 4.6 g/dL (ref 3.6–5.1)
Alkaline phosphatase (APISO): 59 U/L (ref 35–144)
BUN: 23 mg/dL (ref 7–25)
CO2: 27 mmol/L (ref 20–32)
Calcium: 9.5 mg/dL (ref 8.6–10.3)
Chloride: 100 mmol/L (ref 98–110)
Creat: 1.3 mg/dL (ref 0.70–1.35)
Globulin: 3 g/dL (ref 1.9–3.7)
Glucose, Bld: 119 mg/dL — ABNORMAL HIGH (ref 65–99)
Potassium: 4.7 mmol/L (ref 3.5–5.3)
Sodium: 137 mmol/L (ref 135–146)
Total Bilirubin: 0.6 mg/dL (ref 0.2–1.2)
Total Protein: 7.6 g/dL (ref 6.1–8.1)
eGFR: 62 mL/min/1.73m2 (ref >=60)

## 2024-09-07 LAB — LIPID PANEL
Cholesterol: 219 mg/dL — ABNORMAL HIGH (ref <200)
HDL: 43 mg/dL (ref >=40)
LDL Cholesterol (Calc): 140 mg/dL — ABNORMAL HIGH
Non-HDL Cholesterol (Calc): 176 mg/dL — ABNORMAL HIGH (ref <130)
Total CHOL/HDL Ratio: 5.1 (calc) — ABNORMAL HIGH (ref <5.0)
Triglycerides: 219 mg/dL — ABNORMAL HIGH (ref <150)

## 2024-09-07 LAB — MICROALBUMIN / CREATININE URINE RATIO
Creatinine, Urine: 167 mg/dL (ref 20–320)
Microalb Creat Ratio: 4 mg/g{creat} (ref <30)
Microalb, Ur: 0.7 mg/dL

## 2024-09-07 LAB — T4, FREE: Free T4: 1.8 ng/dL (ref 0.8–1.8)

## 2024-09-07 LAB — PSA: PSA: 0.46 ng/mL (ref <=4.00)

## 2024-09-07 LAB — VITAMIN D 25 HYDROXY (VIT D DEFICIENCY, FRACTURES): Vit D, 25-Hydroxy: 35 ng/mL (ref 30–100)

## 2024-09-07 LAB — TSH: TSH: 4.05 m[IU]/L (ref 0.40–4.50)

## 2024-09-15 ENCOUNTER — Ambulatory Visit
Admission: RE | Admit: 2024-09-15 | Discharge: 2024-09-15 | Disposition: A | Discharge status: Home / Self Care | Source: Ambulatory Visit | Attending: Family Medicine | Admitting: Family Medicine

## 2024-09-15 ENCOUNTER — Ambulatory Visit: Payer: Self-pay | Admitting: Family Medicine

## 2024-09-15 DIAGNOSIS — E1169 Type 2 diabetes mellitus with other specified complication: Secondary | ICD-10-CM | POA: Insufficient documentation

## 2024-09-15 DIAGNOSIS — E785 Hyperlipidemia, unspecified: Secondary | ICD-10-CM

## 2024-09-15 DIAGNOSIS — E1142 Type 2 diabetes mellitus with diabetic polyneuropathy: Secondary | ICD-10-CM | POA: Insufficient documentation

## 2024-10-09 ENCOUNTER — Other Ambulatory Visit: Payer: Self-pay | Admitting: Pharmacist

## 2024-10-09 DIAGNOSIS — E1142 Type 2 diabetes mellitus with diabetic polyneuropathy: Secondary | ICD-10-CM

## 2024-10-09 DIAGNOSIS — E1169 Type 2 diabetes mellitus with other specified complication: Secondary | ICD-10-CM

## 2024-10-09 DIAGNOSIS — Z7984 Long term (current) use of oral hypoglycemic drugs: Secondary | ICD-10-CM

## 2024-10-09 NOTE — Progress Notes (Signed)
 "  10/09/2024 Name: Chad Rose. MRN: [id number] DOB: 05-25-1961  Chief Complaint  Patient presents with   Medication Management   Medication Assistance    [name]. is a 64 y.o. year old male who presented for a telephone visit.   They were referred to the pharmacist by their PCP for assistance in managing diabetes, hyperlipidemia, and medication access.      Subjective:   Care Team: Primary Care Provider: Edman Marsa PARAS, DO; Next Scheduled Visit: 03/06/2025 Dietician: Katheleen Aleene ORN, RD  Medication Access/Adherence  Current Pharmacy:  [facility] - [place], [place] - [address] [place] [address] Phone: [phone] Fax: [phone]  [facility] [address] [place] [address] Phone: [phone] Fax: [phone]  [facility] [address]), [place] - [address] [place]) [place] [address] Phone: [phone] Fax: [phone]   Patient reports affordability concerns with their medications: No Patient reports access/transportation concerns to their pharmacy: No  Patient reports adherence concerns with their medications:  No       Diabetes:   Current medications:  - metformin  ER 500 mg daily - Rybelsus  7 mg daily before breakfast - Increased to current dose ~1 month ago. Reports tolerating well Confirms understanding to take on an empty stomach with up to 4 ounces of plain water  only, >=30 minutes before eating food, drinking beverages, or taking other oral medications   Medications tried in the past: metformin  IR - reports unable to tolerate due to GI side effects   Reports recent morning fasting readings: yesterday: 121; today: 148 - Using Accu Check Guide meter  - Previously using Freestyle Libre 3 Plus CGM, but not covered through current insurance for him   Reports has noticed appetite control benefit;  eating smaller meals now   - Current home weight: ~162 lbs  Statin therapy: rosuvastatin  10 mg daily, but admits to not taking consistently. Reports taking ~2-3 times/week as forgets to take at bedtime  Current medication access support: Rybelsus  savings card from manufacturer     Objective:  Lab Results  Component Value Date   HGBA1C 7.9 (H) 09/06/2024    Lab Results  Component Value Date   CREATININE 1.30 09/06/2024   BUN 23 09/06/2024   NA 137 09/06/2024   K 4.7 09/06/2024   CL 100 09/06/2024   CO2 27 09/06/2024    Lab Results  Component Value Date   CHOL 219 (H) 09/06/2024   HDL 43 09/06/2024   LDLCALC 140 (H) 09/06/2024   TRIG 219 (H) 09/06/2024   CHOLHDL 5.1 (H) 09/06/2024    Medications Reviewed Today     Reviewed by Alana Sharyle LABOR, RPH-CPP (Pharmacist) on 10/09/24 at 1127  Med List Status: <None>   Medication Order Taking? Sig Documenting Provider Last Dose Status Informant  Blood Glucose Monitoring Suppl (ACCU-CHEK GUIDE) w/Device KIT [id number]  by Does not apply route. [provider]  Active   glucose blood (TRUE METRIX BLOOD GLUCOSE TEST) test strip [id number]  Use to check blood sugar up to twice daily Edman Marsa PARAS, DO  Active   metFORMIN  (GLUCOPHAGE -XR) 500 MG 24 hr tablet [id number] Yes Take 1 tablet (500 mg total) by mouth daily with supper. Edman Marsa PARAS, DO  Active   rosuvastatin  (CRESTOR ) 10 MG tablet [id number] Yes Take 1 tablet (10 mg total) by mouth at bedtime. Edman Marsa  J, DO  Active   Semaglutide  (RYBELSUS ) 7 MG TABS [id number] Yes Take 1 tablet by mouth each morning at least 30 minutes before any food, beverage, or other oral medications with no more than 4 ounces of plain water  only. Edman Marsa PARAS, DO  Active   sildenafil  (REVATIO ) 20 MG tablet [id number]  Take 1-5 pills about 30 min prior to sex. Start with 1 and increase as needed. Edman Marsa PARAS, DO  Active   tamsulosin   (FLOMAX ) 0.4 MG CAPS capsule [id number]  Take 1 capsule (0.4 mg total) by mouth daily. Edman Marsa PARAS, DO  Active   TRUEplus Lancets 33G MISC [id number]  Use to check blood sugar up to twice daily Edman Marsa PARAS, DO  Active               Assessment/Plan:   Patient to contact [facility] for refill of his Rybelsus    Recommend patient to start using weekly pillbox as adherence aid.   Advise patient okay to take rosuvastatin  each morning, rather than at bedtime to aid with adherence to taking daily  Encourage patient to take his metformin  ER doses consistently to aid with tolerability  Remind patient to be consistent with taking Rybelsus  on an empty stomach with up to 4 ounces of plain water  only, >=30 minutes before eating food, drinking beverages, or taking other oral medications   Diabetes: - Currently uncontrolled - Have reviewed long term cardiovascular and renal outcomes of uncontrolled blood sugar - Have reviewed dietary modifications including importance of having regular well-balanced meals throughout the day, while controlling carbohydrate portion sizes Encourage patient to make sure that he is getting lean proteins, fruits and vegetables, whole grains and adequate hydration with his meals - Recommend to monitor home blood sugar, keep log of results and have this record to review at upcoming medical appointments. Patient to contact provider office sooner if needed for readings outside of established parameters or symptoms      Follow Up Plan: Clinical Pharmacist will follow up with patient by telephone on 11/27/2024 at 10:30 AM    Sharyle Sia, PharmD, [name], CPP Clinical Pharmacist [facility] [phone]   "

## 2024-10-09 NOTE — Patient Instructions (Signed)
 Goals Addressed             This Visit's Progress    Pharmacy Goals       The goal A1c is less than 7%. This is the best way to reduce the risk of the long term complications of diabetes, including heart disease, kidney disease, eye disease, strokes, and nerve damage. An A1c of less than 7% corresponds with fasting sugars less than 130 and 2 hour after meal sugars less than 180. Please start using the Freestyle Libre 3 Plus to monitor your blood sugar readings.   Our goal bad cholesterol, or LDL, is less than 70 . This is why it is important to continue taking your rosuvastatin.  Thank you!  Estelle Grumbles, PharmD, Patsy [place], CPP Clinical Pharmacist [facility] [phone]

## 2024-10-11 ENCOUNTER — Other Ambulatory Visit: Payer: Self-pay

## 2024-11-27 ENCOUNTER — Other Ambulatory Visit

## 2025-03-06 ENCOUNTER — Ambulatory Visit: Admitting: Family Medicine
